# Patient Record
Sex: Male | Born: 1965 | Race: White | Hispanic: No | Marital: Single | Smoking: Current every day smoker
Health system: Southern US, Community
[De-identification: ages and names within clinical notes are randomized; demographics above are authoritative.]

## PROBLEM LIST (undated history)

## (undated) DIAGNOSIS — K219 Gastro-esophageal reflux disease without esophagitis: Secondary | ICD-10-CM

## (undated) DIAGNOSIS — K2970 Gastritis, unspecified, without bleeding: Secondary | ICD-10-CM

## (undated) DIAGNOSIS — B9681 Helicobacter pylori [H. pylori] as the cause of diseases classified elsewhere: Secondary | ICD-10-CM

## (undated) DIAGNOSIS — S42009A Fracture of unspecified part of unspecified clavicle, initial encounter for closed fracture: Secondary | ICD-10-CM

## (undated) DIAGNOSIS — T18128A Food in esophagus causing other injury, initial encounter: Secondary | ICD-10-CM

## (undated) HISTORY — PX: FACIAL FRACTURE SURGERY: SHX1570

---

## 1999-03-05 ENCOUNTER — Encounter: Payer: Self-pay | Admitting: Emergency Medicine

## 1999-03-05 ENCOUNTER — Emergency Department (HOSPITAL_COMMUNITY): Admission: EM | Admit: 1999-03-05 | Discharge: 1999-03-05 | Payer: Self-pay | Admitting: Emergency Medicine

## 1999-04-03 ENCOUNTER — Encounter: Payer: Self-pay | Admitting: Orthopedic Surgery

## 1999-04-03 ENCOUNTER — Ambulatory Visit (HOSPITAL_COMMUNITY): Admission: RE | Admit: 1999-04-03 | Discharge: 1999-04-04 | Payer: Self-pay | Admitting: Orthopedic Surgery

## 1999-06-21 ENCOUNTER — Ambulatory Visit (HOSPITAL_COMMUNITY): Admission: RE | Admit: 1999-06-21 | Discharge: 1999-06-21 | Payer: Self-pay | Admitting: Orthopedic Surgery

## 2002-07-09 ENCOUNTER — Emergency Department (HOSPITAL_COMMUNITY): Admission: EM | Admit: 2002-07-09 | Discharge: 2002-07-09 | Payer: Self-pay | Admitting: Emergency Medicine

## 2002-10-12 ENCOUNTER — Emergency Department (HOSPITAL_COMMUNITY): Admission: EM | Admit: 2002-10-12 | Discharge: 2002-10-12 | Payer: Self-pay | Admitting: *Deleted

## 2005-10-09 ENCOUNTER — Emergency Department (HOSPITAL_COMMUNITY): Admission: EM | Admit: 2005-10-09 | Discharge: 2005-10-10 | Payer: Self-pay | Admitting: Emergency Medicine

## 2006-10-24 ENCOUNTER — Emergency Department (HOSPITAL_COMMUNITY): Admission: EM | Admit: 2006-10-24 | Discharge: 2006-10-24 | Payer: Self-pay | Admitting: Emergency Medicine

## 2006-10-31 ENCOUNTER — Ambulatory Visit (HOSPITAL_COMMUNITY): Admission: RE | Admit: 2006-10-31 | Discharge: 2006-11-01 | Payer: Self-pay | Admitting: Oral and Maxillofacial Surgery

## 2007-03-07 ENCOUNTER — Emergency Department (HOSPITAL_COMMUNITY): Admission: EM | Admit: 2007-03-07 | Discharge: 2007-03-07 | Payer: Self-pay | Admitting: Emergency Medicine

## 2007-12-29 ENCOUNTER — Emergency Department (HOSPITAL_COMMUNITY): Admission: EM | Admit: 2007-12-29 | Discharge: 2007-12-29 | Payer: Self-pay | Admitting: Emergency Medicine

## 2007-12-29 ENCOUNTER — Ambulatory Visit: Payer: Self-pay | Admitting: Psychiatry

## 2007-12-29 ENCOUNTER — Inpatient Hospital Stay (HOSPITAL_COMMUNITY): Admission: RE | Admit: 2007-12-29 | Discharge: 2008-01-06 | Payer: Self-pay | Admitting: *Deleted

## 2008-01-07 ENCOUNTER — Emergency Department (HOSPITAL_COMMUNITY): Admission: EM | Admit: 2008-01-07 | Discharge: 2008-01-07 | Payer: Self-pay | Admitting: Emergency Medicine

## 2010-11-07 NOTE — H&P (Signed)
NAME:  Samuel Garcia, Samuel Garcia NO.:  [id number]   MEDICAL RECORD NO.:  [id number]          PATIENT TYPE:  IPS   LOCATION:  0300                          [facility]ACILITY:  [facility]   PHYSICIAN:  Geoffery Lyons, M.D.      DATE OF BIRTH:  1965-07-21   DATE OF ADMISSION:  12/29/2007  DATE OF DISCHARGE:                       PSYCHIATRIC ADMISSION ASSESSMENT   TIME OF ASSESSMENT:  9:40 a.m.   IDENTIFYING INFORMATION:  A 45 year old Caucasian male, divorced.  This  is a voluntary admission.   HISTORY OF PRESENT ILLNESS:  Samuel Garcia presented in the emergency room  yesterday after attempting to cut his wrist.  He has apparently tried to  cut his wrist with a razor and then thought better of it, and called the  sheriff, who brought him to the emergency room.Marland Kitchen  He reports that he has  been hopeless about his drug use and alcohol use.  Because of this, his  ex-wife will not allow him to see his 6 year old daughter.  Additionally, work has been giving him feedback that his mood is up and  down, that he is tearful and aggressive at work, sometimes, and they  would like to see him get treatment.  He endorses mood swings, depressed  mood and abuse of alcohol and cocaine.  He reports that he has been  drinking a fifth a day, most everyday of the week for several months and  is using cocaine mostly on the weekends.  Also taking Xanax, to help him  come down from the alcohol and cocaine, taking 4-5 at a time, and taking  an occasional Vicodin in spite of the itching that it causes him.  He  reports that he just falls apart mood-wise and cannot function if he  does not get his alcohol every day.  Endorses considerable shame and  guilt about this, depressed that he is not allowed to see his child  since February of this year, but is motivated for abstinence because his  ex-wife has promised him that then he can resume visitation with his  daughter.   PAST PSYCHIATRIC HISTORY:  First [facility] admission.  He has  a history of  prior admissions to [facility] in [place] for detox, [facility] twice, once for  detox and once for a 28-day rehab stay, and most recently about 6 years  ago was admitted to [facility] and afterwards had 2 years  of abstinence, during which he felt he functioned quite well.  He has a  history of 2 prior suicide attempts.   SOCIAL HISTORY:  Previously married, now divorced.  Has one 102 year old  daughter, who lives in the area.  His mother also lives in town and he  is close to her.  She is supportive.  Has a history of legal charges and  license suspended, but no current legal charges.  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   FAMILY HISTORY:  A grandfather and father with alcohol abuse.  Mother  with previous drug addiction, brother with drug addiction.   MEDICAL  HISTORY:  No regular primary care Samuel Garcia.  Medical problems  are superficial laceration, left wrist.   PAST MEDICAL HISTORY:  He has a history of previous facial fractures  with surgery, history of polysubstance abuse.  Positive history of  blackouts.   CURRENT MEDICATIONS:  None.   DRUG ALLERGIES:  HYDROCODONE WHICH CAUSES ITCHING.   PHYSICAL EXAMINATION:  Positive physical findings of physical exam done  in the emergency room, superficial laceration left wrist.  Did not  require sutures.  Motor and neuro nonfocal.  He does have rather  elaborate tattoos over his upper body.   DIAGNOSTIC STUDIES:  Were done in the emergency room.  CBC, WBC 7.3,  hemoglobin 15.8, hematocrit 46.9, platelets 241,000, MCV 94.6.  Chemistry, sodium 137, potassium 4.3, chloride 98, carbon dioxide 30,  BUN 8, creatinine 0.98 and random glucose 98.  Liver enzymes, SGOT 26,  SGPT 23, alkaline phosphatase 68 and total bilirubin 1.2.  TSH is  currently pending.  Alcohol level 229 mg/dL.  On admission urine drug  screen was positive for cocaine.   MENTAL STATUS  EXAMINATION:  Fully alert male, tearful, but appropriate,  polite, normal motor.  Speech coherent, relevant.  Gives an adequate  history.  Affect is blunted, fragile, readily tearful.  Mood depressed  with expressing some guilt and considerable shame over his inability to  parent and maintain relationship with his daughter.  This, he  recognizes, is due to his alcohol abuse and is his main motivation for  abstinence.  Thought processes logical, coherent.  Insight is adequate.  Has clearly been having some suicidal thoughts.  No active suicidal  thoughts today.  No signs of delirium or confusion.  Immediate, recent  and remote memory are intact.  Cognition is intact.  No evidence of  psychosis or homicidal thought.   AXIS I:  Alcohol abuse and dependence, polysubstance abuse.  Rule out  substance-induced mood disorder.  AXIS II:  Deferred.  AXIS III:  Superficial laceration, left wrist.  AXIS IV:  Severe issues with parenting and possible occupational issues.  AXIS V:  Current 48, past year not known.   PLAN:  Voluntarily admit and to evaluate his mood and give him a safe  detox within 4 days.  He has agreed to a family session with his mother,  who is his main support person.  We are waiting a return TSH and have  given him trazodone p.r.n. at bedtime and have started him on a Librium  detox protocol.  He is enrolled in our dual diagnosis program and will  evaluate his mood, as he makes detox progress.      Margaret A. Scott, N.P.      Geoffery Lyons, M.D.  Electronically Signed    MAS/MEDQ  D:  12/30/2007  T:  12/30/2007  Job:  [id number]

## 2010-11-10 NOTE — Op Note (Signed)
NAME:  Samuel Garcia, Samuel Garcia              ACCOUNT NO.:  [id number]   MEDICAL RECORD NO.:  [id number]          PATIENT TYPE:  OIB   LOCATION:  5735                         FACILITY:  [facility]   PHYSICIAN:  Marcelo Baldy, DDS, MDDATE OF BIRTH:  12-01-1965   DATE OF PROCEDURE:  10/31/2006  DATE OF DISCHARGE:  11/01/2006                               OPERATIVE REPORT   PREOPERATIVE DIAGNOSES:  1. Jerry Caras III fracture.  2. Bilateral orbital floor fractures.  3. Nasal fracture.  4. Nasal septal fracture.   POSTOPERATIVE DIAGNOSES:  1. Jerry Caras III fracture.  2. Bilateral orbital floor fractures.  3. Nasal fracture.  4. Nasal septal fracture.   SURGEON:  Marcelo Baldy, D.D.S., M.D.   PROCEDURE PERFORMED:  1. Open reduction and internal fixation of Le Fort III fracture.  2. Open reduction and internal fixation with orbital floor      reconstruction of left orbital floor fracture.  3. Open reduction and exploration of right orbital floor fracture.  4. Closed reduction of nasal septal fracture.  5. Closed reduction of nasal fracture.   ESTIMATED BLOOD LOSS:  150 mL.   COMPLICATIONS:  None.   INDICATIONS FOR PROCEDURE:  The patient is a 44 year old male who woke  up on the morning of May 1 and noted that he had severe facial pain and  swelling but had no recollection of what happened to him the night  before.  He presented to the [facility] Emergency Room where a facial CT  scan was done as well as a head CT and C-spine studies.  He was found to  have multiple nasal fractures and was given followup in our office the  following Monday.  On his preoperative exam, his entire mid face complex  was found to be mobile up to the zygomaticofrontal sutures including his  nasal complex.  He was also noted to have severe diplopia in all  directions of gaze and entrapment with his extraocular movements  especially in the left eye.  He was sent to the ophthalmologist for  evaluation and  clearance for surgery and was cleared for his surgery by  Dr. Dagoberto Ligas.  After all the risks, benefits and alternatives of the  procedure were explained to the patient, he freely consented to the  procedure.   PROCEDURE IN DETAIL:  The patient was initially identified in the  holding area and with a fine surgical marker, the lateral aspect of his  upper eyelid creases were marked.  He ws then taken to the operating  room, placed on the operating room table in the supine position.  Leads  and monitors were placed by the anesthesia team in a standard fashion.  He was then induced and intubated nasally.  Bilateral breath sounds and  end tidal CO2 confirmed accurate tube placement.  The nasotracheal tube  was secured to the patient's head via the surgical team in a standard  fashion.  The patient was then prepped and draped in the appropriate  sterile fashion for a facial trauma procedure.  Corneal shields were  placed with Lacri-Lube in both eyes.  Approximately 15 mL of 1%  lidocaine with 1:100,000 epinephrine was infiltrated along the planned  incision lines which included bilateral upper eyelid creases, bilateral  transconjunctival incisions and a bilateral maxillary vestibular  incision.  Approximately 10 mL of 1% lidocaine with 1:100,000  epinephrine was locally infiltrated about the nasal bone and along the  nasal septum.  First, Erich arch bars were adapted to the maxillary  mandibular dentition and secured into place using 26 gauge interdental  wires.  The patient was then placed into maxillomandibular fixation.  Prior to this being done, a moist Ray-Tec sponge was placed in the  patient's oropharynx with a throat pack and the patient's mouth was  prepared with chlorhexidine and a tooth brush.  After the patient was  placed into maxillomandibular fixation, attention was turned to the  patient's left side.  An incision was made in the lateral aspect of the  upper eyelid creased  through the skin.  The incision was then carefully  retracted over the zygomaticofrontal suture.  The incision was then  carried down through the periosteum and subperiosteal dissection was  carried out to expose the left zygomaticofrontal fracture.  The fracture  in this area was noted to be severely displaced.  A moist Ray-Tec sponge  was placed and attention was turned to the intraoral exposure of the  maxillary and zygomatic fractures.   An incision was made in the right maxillary vestibule with the 15 blade.  The incision was carried down through the periosteum.  Subperiosteal  dissection was carried out exposing the anterior maxillary wall, the  piriform rim and the zygomatic buttress.  The patient was noted to have  a severely comminuted fracture in this area through the piriform rim  through the anterior maxillary wall, posterior maxillary wall and the  segment buttress.  Reduction of the zygomaticomaxillary complex fracture  was attempted with [facility] elevator, however, I was not able to reduce  the fracture satisfactorily so at this point, a Carroll-Girard screw was  deemed necessary to assist in reduction of the fracture.  Approximately  1 mL of 1% lidocaine with 1:100,000 epinephrine was infiltrated in the  skin overlying the prominence of the zygoma.  A 1-cm incision was made  through the skin and blunt dissection was carried down to the  periosteum.  The periosteum was then incised and subperiosteal  dissection was carried out to expose a small area just large enough to  place a pilot hole and the Carroll-Girard screw into the prominence of  the zygoma.  A Carroll-Girard screw was placed and the zygoma was  manipulated so that the fracture was reduced in a satisfactory fashion.   At this point, a moist pack was placed intraorally and we turned our  attention to the transconjunctival incision on the left side.  A  transconjunctival incision was created using needle tip  monopolar cautery.  The globe was protected at all times with a corneal shield and  a Yagar retractor.  The transconjunctival incision was carried  retroseptally down to the periosteum.  The periosteum was incised with  the monopolar cautery.  The subperiosteal dissection was then carried  out along the orbital floor to expose the orbital floor fracture.  The  orbital floor fracture was noted to be approximately 1.5 x 1.5 cm.  Orbital contents had herniated into the maxillary sinus.  These were  gently elevated and released and the infraorbital rim fracture was able  to be visualized directly while reducing the  zygomaticomaxillary complex  fracture.   At this point, fixation was begun starting with the zygomaticofrontal  suture and using the Carroll-Girard screw to hole the zygoma in its  appropriately reduced position.  A 6-hole 1.7 mm Stryker plate was used  to span the zygomaticofrontal suture and was secured in place with  several screws.  Attention was then turned to the fracture's access  through our intraoral incision.  Several 1.7 mm Stryker plates were used  to reapproximated the severely comminuted left maxillary wall and  piriform rim and zygomatic buttress fractures.  The posterior maxillary  wall was noted to be fractured as well and a large segment was loose,  however, was still attached to the mucosa on the medial side and  somewhat to the periosteum laterally.  This portion of bone was secured  in place using a 26-gauge wire, wiring it to its junction at the  zygomatic buttress.   Attention was then turned back to the orbital floor.  The orbital  contents were once again freed gently from the maxillary sinus.  The  orbital floor was reconstructed with a 1.2 mm Stryker orbital floor  plate, which was secured into place using several screws along the  orbital rim.  This also helped secure the infraorbital rim fracture. The  mobility of the eye was then tested using a  force duction test and all  of these tissues were noted to be freely mobile.  All of the fracture  sites on the left side were then irrigated copiously and a moist pack  was placed intraorally and attention was turned to the patient's right  side.   An incision was made in the right upper eyelid crease in the lateral  aspect.  The incision was approximately 0.5 cm in length.  The incision  was retracted carefully over the zygomaticofrontal suture and the  incision was carried down through the periosteum overlying the  zygomaticofrontal suture.  Subperiosteal dissection was carried out  exposing the fracture which was noted to be minimally displaced.  A  moist pack was placed and attention was turned to the intraoral  incision.   An incision was created in the right maxillary vestibule which was  joined with the left incision.  The incision was carried down through  the periosteum.  Subperiosteal dissection was carried out exposing the intramaxillary wall buttress and the piriform rim area.  Fractures were  noted through the piriform rim and along the maxillary buttress as well.  These fractures again were only minimally displaced.  A moist pack was  placed.   Attention was turned to the right transconjunctival incision.  This  incision was created after protecting the globe with corneal shield and  Yagar retractor through the conjunctiva using a needle point monopolar  cautery.  The incision was carried retroseptally down through the  periosteum.  The periosteal dissection was carried out exposing the  right orbital floor fracture.  The fracture was noted to be linear in  nature, however, there were some orbital contents that were trapped  within the fracture line.  These contents were gently freed and after  the contents were freed, the fracture line was again noted to simply be  linear and there was no defect.  A piece of Gelfilm was placed along the  orbital floor and the wound  edges were ensured to be inverted and  hemostasis was verified prior to leaving this particular incision site.  A Goldman elevator was used to gently  reduce the right  zygomaticomaxillary complex fracture and the zygoma was secured into  place using a 4-hole 1.2 mm plate at the right zygomaticofrontal suture  and multiple 1.7 mm plates along the right zygomatic buttress and the  right piriform rim area.  After this was completed, all of the sites  were irrigated copiously on both sides and attention was turned to  closing all the incision.   The intraoral incision was closed using 2-0 PDS suture to reapproximate  the alar bases and a 3-0 chromic gut suture to close the midline aspect  of the incision in a V-Y fashion and the remainder of the incision in a  running baseball stitch.  The right and left upper eyelid incisions were  closed in layers using 4-0 Vicryl in the orbicularis oculi layer and  periosteal layers.  The skin was then closed using 6-0 Prolene suture in  a running fashion.  The transconjunctival incisions were simply  reapproximated manually being sure to evert the edges of the tissue.  Both corneal shields were removed.  The cheek incision where a Campbell Riches screw was placed was closed with a 5-0 plain gut suture.   At this point, ice soaked eye patch were placed on bilateral eyes and  attention was turned to the nasal fracture.   Due to the nasal tubes being in place, manipulation of the nose was  mostly through the left side where the fracture was more pronounced.  A  Goldman elevator was used to elevate the nasal bone and an Asch forceps  was used to place the septum in the midline.  A Denver splint was then  adapted and secured in place using Mastisol and Steri-Strips.   At this point, the patient was taken out of maxillomandibular fixation.  The oropharynx was suctioned thoroughly.  The throat pack was removed  and the oropharynx was once again suctioned  thoroughly.  The patient was placed back into maxillomandibular fixation using 26-gauge wire and at  this point, was turned over to the anesthesia team for awakening and  extubation.  The patient was extubated without difficulty and after  extubation, reinspection of the nasal bone and reelevation of the nasal  bones was performed to ensure that the extubation did not move the nasal  bones or nasal septum.   At this point, the patient was transported to the recovery room in  stable condition, having tolerated the procedure well.  At the end of  the procedure, all needle and sponge counts were correct times 2 and  there were no complications for the procedure.      Marcelo Baldy, DDS, MD  Electronically Signed     TGB/MEDQ  D:  11/14/2006  T:  11/14/2006  Job:  [id number]
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
## 2010-11-10 NOTE — H&P (Signed)
[place]. [facility]  Patient:    Samuel Garcia                      MRN: [id number] Adm. Date:  [id number] Disc. Date: [id number] Attending:  Wende Mott                         History and Physical  CHIEF COMPLAINT:  Painful left shoulder.  HISTORY OF PRESENT ILLNESS:  This is a 45 year old male who has sustained a left comminuted clavicle fracture a few months ago, and had to undergo an open reduction and internal fixation.  The patient presently is having some discomfort because the plate and screw has come loose, and are pressing up against the skin.  The patients x-rays revealed good callus of bone formation across the fracture.  PAST MEDICAL HISTORY:  That of an ORIF of the left clavicle.  No history of high blood pressure or diabetes mellitus.  ALLERGIES:  HYDROCODONE causes hives.  CURRENT MEDICATIONS:  Darvocet and Vioxx.  SOCIAL HISTORY:  Habits:  Smokes one pack of cigarettes per day.  FAMILY HISTORY:  Noncontributory.  REVIEW OF SYSTEMS:  Basically that of the history of present illness. No cardiac or respiratory.  No urinary or bowel symptoms.  PHYSICAL EXAMINATION:  VITAL SIGNS:  Temperature 98.9 degrees, pulse 92, respirations 18, blood pressure 143/82.  Height 5 feet 11 inches, weight 211 pounds.  HEENT:  Normocephalic.  Eyes:  Conjunctivae and sclerae clear.  NECK:  Supple.  CHEST:  Clear.  CARDIAC:  S1, S2 regular.  EXTREMITIES:  Left shoulder:  Prominence of the palpable plate over the left clavicle.  The fracture site itself feels stable.  Range of motion of the left shoulder is good with good muscle tone.  Neurovascular status is intact.  IMPRESSION:  Loose hardware of the left clavicle. DD:  08/08/99 TD:  08/08/99 Job: [id number]

## 2011-03-22 LAB — RAPID URINE DRUG SCREEN, HOSP PERFORMED
Amphetamines: NOT DETECTED
Barbiturates: NOT DETECTED
Cocaine: POSITIVE — AB
Opiates: NOT DETECTED

## 2011-03-22 LAB — BASIC METABOLIC PANEL
BUN: 8
CO2: 30
Glucose, Bld: 98
Potassium: 4.3
Sodium: 137

## 2011-03-22 LAB — URINALYSIS, ROUTINE W REFLEX MICROSCOPIC
Hgb urine dipstick: NEGATIVE
Nitrite: NEGATIVE
Specific Gravity, Urine: <1.005 — ABNORMAL LOW
Urobilinogen, UA: 0.2
pH: 6

## 2011-03-22 LAB — DIFFERENTIAL
Basophils Absolute: 0
Basophils Relative: 0
Eosinophils Absolute: 0.8 — ABNORMAL HIGH
Eosinophils Relative: 10 — ABNORMAL HIGH
Monocytes Absolute: 0.7

## 2011-03-22 LAB — CBC
HCT: 46.9
Hemoglobin: 15.8
MCHC: 33.8
RDW: 13.7

## 2011-03-22 LAB — HEPATIC FUNCTION PANEL
Bilirubin, Direct: 0.1
Indirect Bilirubin: 1.1 — ABNORMAL HIGH

## 2011-03-22 LAB — TSH: TSH: 1.301 (ref 0.350–4.500)

## 2011-04-06 LAB — BASIC METABOLIC PANEL
Chloride: 108
GFR calc non Af Amer: >60
Glucose, Bld: 97
Potassium: 3.8
Sodium: 144

## 2011-04-06 LAB — RAPID URINE DRUG SCREEN, HOSP PERFORMED
Barbiturates: NOT DETECTED
Opiates: NOT DETECTED
Tetrahydrocannabinol: NOT DETECTED

## 2013-07-26 DIAGNOSIS — T18128A Food in esophagus causing other injury, initial encounter: Secondary | ICD-10-CM

## 2013-07-26 HISTORY — DX: Food in esophagus causing other injury, initial encounter: T18.128A

## 2013-08-06 ENCOUNTER — Encounter (HOSPITAL_COMMUNITY)
Admission: EM | Disposition: A | Discharge status: Home / Self Care | Payer: Self-pay | Source: Home / Self Care | Attending: Emergency Medicine

## 2013-08-06 DIAGNOSIS — R1310 Dysphagia, unspecified: Secondary | ICD-10-CM

## 2013-08-06 DIAGNOSIS — K259 Gastric ulcer, unspecified as acute or chronic, without hemorrhage or perforation: Secondary | ICD-10-CM

## 2013-08-06 DIAGNOSIS — K2990 Gastroduodenitis, unspecified, without bleeding: Secondary | ICD-10-CM

## 2013-08-06 DIAGNOSIS — K2970 Gastritis, unspecified, without bleeding: Secondary | ICD-10-CM

## 2013-08-06 DIAGNOSIS — T18108A Unspecified foreign body in esophagus causing other injury, initial encounter: Secondary | ICD-10-CM

## 2013-08-06 HISTORY — PX: FOREIGN BODY REMOVAL: SHX962

## 2013-08-06 SURGERY — REMOVAL, FOREIGN BODY
Anesthesia: Moderate Sedation

## 2013-08-07 ENCOUNTER — Emergency Department (HOSPITAL_COMMUNITY)
Admission: EM | Admit: 2013-08-07 | Discharge: 2013-08-07 | Disposition: A | Discharge status: Home / Self Care | Payer: PRIVATE HEALTH INSURANCE | Attending: Emergency Medicine | Admitting: Emergency Medicine

## 2013-08-07 ENCOUNTER — Encounter (HOSPITAL_COMMUNITY): Payer: Self-pay | Admitting: Pharmacy Technician

## 2013-08-07 ENCOUNTER — Encounter (HOSPITAL_COMMUNITY): Payer: Self-pay | Admitting: Emergency Medicine

## 2013-08-07 DIAGNOSIS — K219 Gastro-esophageal reflux disease without esophagitis: Secondary | ICD-10-CM | POA: Diagnosis present

## 2013-08-07 DIAGNOSIS — K222 Esophageal obstruction: Secondary | ICD-10-CM

## 2013-08-07 DIAGNOSIS — T39095A Adverse effect of salicylates, initial encounter: Secondary | ICD-10-CM | POA: Insufficient documentation

## 2013-08-07 DIAGNOSIS — T18108A Unspecified foreign body in esophagus causing other injury, initial encounter: Secondary | ICD-10-CM

## 2013-08-07 DIAGNOSIS — T18128A Food in esophagus causing other injury, initial encounter: Secondary | ICD-10-CM

## 2013-08-07 DIAGNOSIS — F17200 Nicotine dependence, unspecified, uncomplicated: Secondary | ICD-10-CM | POA: Insufficient documentation

## 2013-08-07 DIAGNOSIS — K2970 Gastritis, unspecified, without bleeding: Secondary | ICD-10-CM | POA: Insufficient documentation

## 2013-08-07 DIAGNOSIS — Q391 Atresia of esophagus with tracheo-esophageal fistula: Secondary | ICD-10-CM | POA: Insufficient documentation

## 2013-08-07 DIAGNOSIS — K200 Eosinophilic esophagitis: Secondary | ICD-10-CM | POA: Insufficient documentation

## 2013-08-07 DIAGNOSIS — Q393 Congenital stenosis and stricture of esophagus: Secondary | ICD-10-CM

## 2013-08-07 DIAGNOSIS — B9681 Helicobacter pylori [H. pylori] as the cause of diseases classified elsewhere: Secondary | ICD-10-CM | POA: Diagnosis present

## 2013-08-07 DIAGNOSIS — IMO0002 Reserved for concepts with insufficient information to code with codable children: Secondary | ICD-10-CM | POA: Insufficient documentation

## 2013-08-07 DIAGNOSIS — K269 Duodenal ulcer, unspecified as acute or chronic, without hemorrhage or perforation: Secondary | ICD-10-CM | POA: Insufficient documentation

## 2013-08-07 DIAGNOSIS — K2990 Gastroduodenitis, unspecified, without bleeding: Secondary | ICD-10-CM

## 2013-08-07 DIAGNOSIS — A048 Other specified bacterial intestinal infections: Secondary | ICD-10-CM | POA: Insufficient documentation

## 2013-08-07 HISTORY — DX: Helicobacter pylori (H. pylori) as the cause of diseases classified elsewhere: B96.81

## 2013-08-07 HISTORY — DX: Gastro-esophageal reflux disease without esophagitis: K21.9

## 2013-08-07 HISTORY — DX: Food in esophagus causing other injury, initial encounter: T18.128A

## 2013-08-07 HISTORY — DX: Gastritis, unspecified, without bleeding: K29.70

## 2013-08-07 LAB — CBC WITH DIFFERENTIAL/PLATELET
BASOS ABS: 0 10*3/uL (ref 0.0–0.1)
Basophils Relative: 0 % (ref 0–1)
EOS ABS: 0.4 10*3/uL (ref 0.0–0.7)
EOS PCT: 4 % (ref 0–5)
HCT: 48.2 % (ref 39.0–52.0)
Hemoglobin: 17.1 g/dL — ABNORMAL HIGH (ref 13.0–17.0)
LYMPHS ABS: 3.6 10*3/uL (ref 0.7–4.0)
Lymphocytes Relative: 41 % (ref 12–46)
MCH: 32.1 pg (ref 26.0–34.0)
MCHC: 35.5 g/dL (ref 30.0–36.0)
MCV: 90.6 fL (ref 78.0–100.0)
Monocytes Absolute: 0.5 10*3/uL (ref 0.1–1.0)
Monocytes Relative: 6 % (ref 3–12)
NEUTROS PCT: 49 % (ref 43–77)
Neutro Abs: 4.2 10*3/uL (ref 1.7–7.7)
PLATELETS: 224 10*3/uL (ref 150–400)
RBC: 5.32 MIL/uL (ref 4.22–5.81)
RDW: 12.4 % (ref 11.5–15.5)
WBC: 8.7 10*3/uL (ref 4.0–10.5)

## 2013-08-07 LAB — BASIC METABOLIC PANEL
BUN: 16 mg/dL (ref 6–23)
CALCIUM: 8.9 mg/dL (ref 8.4–10.5)
CO2: 24 mEq/L (ref 19–32)
Chloride: 102 mEq/L (ref 96–112)
Creatinine, Ser: 0.88 mg/dL (ref 0.50–1.35)
GLUCOSE: 92 mg/dL (ref 70–99)
POTASSIUM: 4.1 meq/L (ref 3.7–5.3)
SODIUM: 143 meq/L (ref 137–147)

## 2013-08-07 MED ORDER — MEPERIDINE HCL 100 MG/ML IJ SOLN
INTRAMUSCULAR | Status: AC
  Filled 2013-08-07: qty 2

## 2013-08-07 MED ORDER — MEPERIDINE HCL 25 MG/ML IJ SOLN
INTRAMUSCULAR | Status: DC | PRN
  Administered 2013-08-07 (×2): 50 mg via INTRAVENOUS

## 2013-08-07 MED ORDER — MIDAZOLAM HCL 5 MG/5ML IJ SOLN
INTRAMUSCULAR | Status: AC
  Filled 2013-08-07: qty 10

## 2013-08-07 MED ORDER — GLUCAGON HCL (RDNA) 1 MG IJ SOLR
1.0000 mg | Freq: Once | INTRAMUSCULAR | Status: AC
  Administered 2013-08-07: 1 mg via INTRAVENOUS
  Filled 2013-08-07: qty 1

## 2013-08-07 MED ORDER — SODIUM CHLORIDE 0.9 % IJ SOLN
INTRAMUSCULAR | Status: AC
  Filled 2013-08-07: qty 10

## 2013-08-07 MED ORDER — PROMETHAZINE HCL 25 MG/ML IJ SOLN
INTRAMUSCULAR | Status: DC | PRN
  Administered 2013-08-07: 12.5 mg via INTRAVENOUS

## 2013-08-07 MED ORDER — OMEPRAZOLE 20 MG PO CPDR: DELAYED_RELEASE_CAPSULE | ORAL | Status: DC

## 2013-08-07 MED ORDER — PROMETHAZINE HCL 25 MG/ML IJ SOLN
INTRAMUSCULAR | Status: AC
  Filled 2013-08-07: qty 1

## 2013-08-07 MED ORDER — MIDAZOLAM HCL 5 MG/5ML IJ SOLN
INTRAMUSCULAR | Status: DC | PRN
  Administered 2013-08-07 (×2): 2 mg via INTRAVENOUS
  Administered 2013-08-07: 1 mg via INTRAVENOUS

## 2013-08-07 NOTE — ED Notes (Signed)
Pt signed permit for EGD

## 2013-08-07 NOTE — H&P (Signed)
  Primary Care Physician:  No PCP Per Patient Primary Gastroenterologist:  Dr. Darrick PennaFields  Pre-Procedure History & Physical: HPI:  Samuel Garcia is a 48 y.o. male here for ?FOOD IMPACTION.   History reviewed. No pertinent past medical history.  History reviewed. No pertinent past surgical history.  Prior to Admission medications   Not on File    Allergies as of 08/07/2013 - Review Complete 08/07/2013  Allergen Reaction Noted  . Hydrocodone  08/07/2013    No family history on file.  History   Social History  . Marital Status: Divorced    Spouse Name: N/A    Number of Children: N/A  . Years of Education: N/A   Occupational History  . Not on file.   Social History Main Topics  . Smoking status: Current Every Day Smoker  . Smokeless tobacco: Not on file  . Alcohol Use: Yes  . Drug Use: No  . Sexual Activity: Not on file   Other Topics Concern  . Not on file   Social History Narrative  . No narrative on file    Review of Systems: See HPI, otherwise negative ROS   Physical Exam: BP 132/86  Pulse 93  Temp(Src) 97.9 F (36.6 C) (Oral)  Resp 20  Ht 5\' 11"  (1.803 m)  Wt 200 lb (90.719 kg)  BMI 27.91 kg/m2  SpO2 97% General:   Alert,  pleasant and cooperative in NAD Head:  Normocephalic and atraumatic. Neck:  Supple; Lungs:  Clear throughout to auscultation.    Heart:  Regular rate and rhythm. Abdomen:  Soft, nontender and nondistended. Normal bowel sounds, without guarding, and without rebound.   Neurologic:  Alert and  oriented x4;  grossly normal neurologically.  Impression/Plan:     FOOD IMPACTION  PLAN:  EGD/?DIL TODAY

## 2013-08-07 NOTE — Op Note (Addendum)
[facility] [address] [place], [id number]   ENDOSCOPY PROCEDURE REPORT  PATIENT: Samuel Garcia, Samuel B.  MR#: [id number] BIRTHDATE: 21-Oct-1965 , 47  yrs. old GENDER: Male  ENDOSCOPIST: Jonette EvaSandi Laymon Stockert, MD REFERRED BY:   DR. Preston FleetingGLICK  PROCEDURE DATE: 08/07/2013 PROCEDURE:   EGD with foreign body removal, EGD with biopsy, and EGD with dilatation over guidewire  INDICATIONS: IMPACTED FOOD BOLUS. SAME THING [place]   dysphagia. TAKES ASA FOR SHOULDER PAIN. MEDICATIONS: Demerol 100 mg IV, Versed 5 mg IV, and Promethazine (Phenergan) 12.5mg  IV TOPICAL ANESTHETIC:   Cetacaine Spray  DESCRIPTION OF PROCEDURE:     Physical exam was performed.  Informed consent was obtained from the patient after explaining the benefits, risks, and alternatives to the procedure.  The patient was connected to the monitor and placed in the left lateral position.  Continuous oxygen was provided by nasal cannula and IV medicine administered through an indwelling cannula.  After administration of sedation, the patients esophagus was intubated and the EG-[id number] ([id number]  endoscope was advanced under direct visualization to the second portion of the duodenum.  The scope was removed slowly by carefully examining the color, texture, anatomy, and integrity of the mucosa on the way out.  The patient was recovered in endoscopy and discharged home in satisfactory condition.   ESOPHAGUS: FOOD BOLUS IMPACTED IN MID-ESOPHAGUS.  REMOVED PIECEMEAL VIA ESOPHAGEAL OVERTUBE.  BIOPSIE SOBTAINED VIA COLD FORCEPS 20 CM AND 35 CM FROM THE TEETH.  GE JUNCTION 40 CM FROM THE TEETH.   A medium sized hiatal hernia was noted.   STOMACH: Multiple small ulcers were found in the gastric body.  Biopsies were taken around the ulcers.   Moderate erosive gastritis (inflammation) was found in the gastric antrum.  Multiple biopsies were performed using cold forceps.   DUODENUM: Multiple small ulcers  were found in the duodenal bulb.   The duodenal mucosa showed no abnormalities in the 2nd part of the duodenum. COMPLICATIONS:   None  ENDOSCOPIC IMPRESSION: DYSPHAGIA DUE TO ESOPHAGEAL WEB V.  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 START OMEPRAZOLE.  TAKE 30 MINUTES PRIOR TO YOUR MEALS TWICE DAILY FOR 3 MOS. STRICTLY AVOID ASPIRIN, BC/GOODY POWDERS, IBUPROFEN/MOTRIN, OR NAPROXEN/ALEVE FOR 7 DAYS. FOLLOW A LOW FAT DIET. BIOPSY WILL BE BACK IN 7 DAYS. FOLLOW UP IN 3 MOS.  REPEAT EGD AFTER NEXT VISIT.  REPEAT EXAM:   _______________________________ Rosalie DoctoreSignedJonette Eva:  Serenity Batley, MD 08/15/2013 10:40 AM Revised: 08/15/2013 10:40 AM      PATIENT NAME:  Samuel Garcia, Samuel B. MR#: [id number]

## 2013-08-07 NOTE — ED Notes (Signed)
Pt states he ate some beef stew this am and thinks a piece of meat is stuck in his throat, cannot swallow anything

## 2013-08-07 NOTE — ED Notes (Signed)
To ENDO suite via stretcher

## 2013-08-07 NOTE — ED Notes (Signed)
For EGD tonight, pt and his wife are aware.

## 2013-08-07 NOTE — ED Provider Notes (Signed)
CSN: [id number]     Arrival date & time 08/07/13  [id number] History   First MD Initiated Contact with Patient 08/07/13 [phone]     Chief complaint: Unable to swallow  (Consider location/radiation/quality/duration/timing/severity/associated sxs/prior Treatment) The history is provided by the patient.   48 year old male was eating some beef stew at about 2 AM and pieces of meat got stuck going down and he has been unable to swallow since then. This has happened twice before and has required surgery to fix the problem on one occasion and on another occasion he received medication which fix that. He does have a history of GERD and was recommended for esophageal dilatation but has never proceeded with that. At home, he tried to force himself to vomit but was unable to dislodge the piece of meat.  History reviewed. No pertinent past medical history. History reviewed. No pertinent past surgical history. No family history on file. History  Substance Use Topics  . Smoking status: Current Every Day Smoker  . Smokeless tobacco: Not on file  . Alcohol Use: Yes    Review of Systems  All other systems reviewed and are negative.      Allergies  Hydrocodone  Home Medications  No current outpatient prescriptions on file. BP 140/93  Pulse 97  Temp(Src) 97.9 F (36.6 C) (Oral)  Resp 18  Ht 5\' 11"  (1.803 m)  Wt 200 lb (90.719 kg)  BMI 27.91 kg/m2  SpO2 95% Physical Exam  Nursing note and vitals reviewed.  48 year old male, resting comfortably and in no acute distress. Vital signs are significant for borderline hypertension with blood pressure 140/93. Oxygen saturation is 95%, which is normal. Head is normocephalic and atraumatic. PERRLA, EOMI. Oropharynx is mildly erythematous. There is no pooling of secretions and he is protecting his airway. Neck is nontender and supple without adenopathy or JVD. Back is nontender and there is no CVA tenderness. Lungs are clear without rales, wheezes, or  rhonchi. Chest is nontender. Heart has regular rate and rhythm without murmur. Abdomen is soft, flat, nontender without masses or hepatosplenomegaly and peristalsis is normoactive. Extremities have no cyanosis or edema, full range of motion is present. Skin is warm and dry without rash. Neurologic: Mental status is normal, cranial nerves are intact, there are no motor or sensory deficits.  ED Course  Procedures (including critical care time) Labs Review Results for orders placed during the hospital encounter of 08/07/13  CBC WITH DIFFERENTIAL      Result Value Ref Range   WBC 8.7  4.0 - 10.5 K/uL   RBC 5.32  4.22 - 5.81 MIL/uL   Hemoglobin 17.1 (*) 13.0 - 17.0 g/dL   HCT 30.848.2  65.739.0 - 84.652.0 %   MCV 90.6  78.0 - 100.0 fL   MCH 32.1  26.0 - 34.0 pg   MCHC 35.5  30.0 - 36.0 g/dL   RDW 96.212.4  95.211.5 - 84.115.5 %   Platelets 224  150 - 400 K/uL   Neutrophils Relative % 49  43 - 77 %   Neutro Abs 4.2  1.7 - 7.7 K/uL   Lymphocytes Relative 41  12 - 46 %   Lymphs Abs 3.6  0.7 - 4.0 K/uL   Monocytes Relative 6  3 - 12 %   Monocytes Absolute 0.5  0.1 - 1.0 K/uL   Eosinophils Relative 4  0 - 5 %   Eosinophils Absolute 0.4  0.0 - 0.7 K/uL   Basophils Relative 0  0 -  1 %   Basophils Absolute 0.0  0.0 - 0.1 K/uL  BASIC METABOLIC PANEL      Result Value Ref Range   Sodium 143  137 - 147 mEq/L   Potassium 4.1  3.7 - 5.3 mEq/L   Chloride 102  96 - 112 mEq/L   CO2 24  19 - 32 mEq/L   Glucose, Bld 92  70 - 99 mg/dL   BUN 16  6 - 23 mg/dL   Creatinine, Ser 1.19  0.50 - 1.35 mg/dL   Calcium 8.9  8.4 - 14.7 mg/dL   GFR calc non Af Amer >90  >90 mL/min   GFR calc Af Amer >90  >90 mL/min   MDM   Final diagnoses:  Esophageal obstruction due to food impaction    Esophageal obstruction from meat. Old records are reviewed and I do not see his prior episodes of esophageal obstruction. He will be given a therapeutic trial of glucagon. If that fails, he will need GI consultation.  He did not improve  after glucagon. Dr. Darrick Penna has been consulted who states she will see him in the ED for endoscopy.  Dione Booze, MD 08/07/13 [phone]

## 2013-08-07 NOTE — Discharge Instructions (Signed)
I dilated your esophagus. You have a stricture near the base of your esophagus. You have a small hiatal hernia. You have ULCERS IN YOU STOMACH AND DUODENUM. I biopsied your ESOPHAGUS AND stomach.   RETURN TO WORK FEB 14.  START OMEPRAZOLE.  TAKE 30 MINUTES PRIOR TO YOUR MEALS TWICE DAILY FOR 3 MOS.  STRICTLY AVOID ASPIRIN, BC/GODDY POWDERS, IBUPROFEN/MOTRIN, OR NAPROXEN/ALEVE FOR 7 DAYS.  FOLLOW A LOW FAT DIET. SEE INFO BELOW.  YOUR BIOPSY WILL BE BACK IN 7 DAYS.  FOLLOW UP IN 3 MOS.    UPPER ENDOSCOPY AFTER CARE Read the instructions outlined below and refer to this sheet in the next week. These discharge instructions provide you with general information on caring for yourself after you leave the hospital. While your treatment has been planned according to the most current medical practices available, unavoidable complications occasionally occur. If you have any problems or questions after discharge, call DR. Kennidi Yoshida, [phone].  ACTIVITY  You may resume your regular activity, but move at a slower pace for the next 24 hours.   Take frequent rest periods for the next 24 hours.   Walking will help get rid of the air and reduce the bloated feeling in your belly (abdomen).   No driving for 24 hours (because of the medicine (anesthesia) used during the test).   You may shower.   Do not sign any important legal documents or operate any machinery for 24 hours (because of the anesthesia used during the test).    NUTRITION  Drink plenty of fluids.   You may resume your normal diet as instructed by your doctor.   Begin with a light meal and progress to your normal diet. Heavy or fried foods are harder to digest and may make you feel sick to your stomach (nauseated).   Avoid alcoholic beverages for 24 hours or as instructed.    MEDICATIONS  You may resume your normal medications.   WHAT YOU CAN EXPECT TODAY  Some feelings of bloating in the abdomen.   Passage of more  gas than usual.    IF YOU HAD A BIOPSY TAKEN DURING THE UPPER ENDOSCOPY:  Eat a soft diet IF YOU HAVE NAUSEA, BLOATING, ABDOMINAL PAIN, OR VOMITING.    FINDING OUT THE RESULTS OF YOUR TEST Not all test results are available during your visit. DR. Darrick Penna WILL CALL YOU WITHIN 7 DAYS OF YOUR PROCEDUE WITH YOUR RESULTS. Do not assume everything is normal if you have not heard from DR. Kawhi Diebold IN ONE WEEK, CALL HER OFFICE AT [phone].  SEEK IMMEDIATE MEDICAL ATTENTION AND CALL THE OFFICE: [phone] IF:  You have more than a spotting of blood in your stool.   Your belly is swollen (abdominal distention).   You are nauseated or vomiting.   You have a temperature over 101F.   You have abdominal pain or discomfort that is severe or gets worse throughout the day.    Low-Fat Diet BREADS, CEREALS, PASTA, RICE, DRIED PEAS, AND BEANS These products are high in carbohydrates and most are low in fat. Therefore, they can be increased in the diet as substitutes for fatty foods. They too, however, contain calories and should not be eaten in excess. Cereals can be eaten for snacks as well as for breakfast.  Include foods that contain fiber (fruits, vegetables, whole grains, and legumes). Research shows that fiber may lower blood cholesterol levels, especially the water-soluble fiber found in fruits, vegetables, oat products, and legumes. FRUITS AND VEGETABLES It is  good to eat fruits and vegetables. Besides being sources of fiber, both are rich in vitamins and some minerals. They help you get the daily allowances of these nutrients. Fruits and vegetables can be used for snacks and desserts. MEATS Limit lean meat, chicken, [place], and fish to no more than 6 ounces per day. Beef, Pork, and Lamb Use lean cuts of beef, pork, and lamb. Lean cuts include:  Extra-lean ground beef.  Arm roast.  Sirloin tip.  Center-cut ham.  Round steak.  Loin chops.  Rump roast.  Tenderloin.  Trim all fat off  the outside of meats before cooking. It is not necessary to severely decrease the intake of red meat, but lean choices should be made. Lean meat is rich in protein and contains a highly absorbable form of iron. Premenopausal women, in particular, should avoid reducing lean red meat because this could increase the risk for low red blood cells (iron-deficiency anemia).  Chicken and [place] These are good sources of protein. The fat of poultry can be reduced by removing the skin and underlying fat layers before cooking. Chicken and [place] can be substituted for lean red meat in the diet. Poultry should not be fried or covered with high-fat sauces. Fish and Shellfish Fish is a good source of protein. Shellfish contain cholesterol, but they usually are low in saturated fatty acids. The preparation of fish is important. Like chicken and [place], they should not be fried or covered with high-fat sauces. EGGS Egg whites contain no fat or cholesterol. They can be eaten often. Try 1 to 2 egg whites instead of whole eggs in recipes or use egg substitutes that do not contain yolk.  MILK AND DAIRY PRODUCTS Use skim or 1% milk instead of 2% or whole milk. Decrease whole milk, natural, and processed cheeses. Use nonfat or low-fat (2%) cottage cheese or low-fat cheeses made from vegetable oils. Choose nonfat or low-fat (1 to 2%) yogurt. Experiment with evaporated skim milk in recipes that call for heavy cream. Substitute low-fat yogurt or low-fat cottage cheese for sour cream in dips and salad dressings. Have at least 2 servings of low-fat dairy products, such as 2 glasses of skim (or 1%) milk each day to help get your daily calcium intake.  FATS AND OILS Butterfat, lard, and beef fats are high in saturated fat and cholesterol. These should be avoided.Vegetable fats do not contain cholesterol. AVOID coconut oil, palm oil, and palm kernel oil, WHICH are very high in saturated fats. These should be limited. These fats are  often used in bakery goods, processed foods, popcorn, oils, and nondairy creamers. Vegetable shortenings and some peanut butters contain hydrogenated oils, which are also saturated fats. Read the labels on these foods and check for saturated vegetable oils.  Desirable liquid vegetable oils are corn oil, cottonseed oil, olive oil, canola oil, safflower oil, soybean oil, and sunflower oil. Peanut oil is not as good, but small amounts are acceptable. Buy a heart-healthy tub margarine that has no partially hydrogenated oils in the ingredients. AVOID Mayonnaise and salad dressings often are made from unsaturated fats.  OTHER EATING TIPS Snacks  Most sweets should be limited as snacks. They tend to be rich in calories and fats, and their caloric content outweighs their nutritional value. Some good choices in snacks are graham crackers, melba toast, soda crackers, bagels (no egg), English muffins, fruits, and vegetables. These snacks are preferable to snack crackers, [place] fries, and chips. Popcorn should be air-popped or cooked in small amounts  of liquid vegetable oil.  Desserts Eat fruit, low-fat yogurt, and fruit ices instead of pastries, cake, and cookies. Sherbet, angel food cake, gelatin dessert, frozen low-fat yogurt, or other frozen products that do not contain saturated fat (pure fruit juice bars, frozen ice pops) are also acceptable.   COOKING METHODS Choose those methods that use little or no fat. They include: Poaching.  Braising.  Steaming.  Grilling.  Baking.  Stir-frying.  Broiling.  Microwaving.  Foods can be cooked in a nonstick pan without added fat, or use a nonfat cooking spray in regular cookware. Limit fried foods and avoid frying in saturated fat. Add moisture to lean meats by using water, broth, cooking wines, and other nonfat or low-fat sauces along with the cooking methods mentioned above. Soups and stews should be chilled after cooking. The fat that forms on top after a few  hours in the refrigerator should be skimmed off. When preparing meals, avoid using excess salt. Salt can contribute to raising blood pressure in some people.  EATING AWAY FROM HOME Order entres, potatoes, and vegetables without sauces or butter. When meat exceeds the size of a deck of cards (3 to 4 ounces), the rest can be taken home for another meal. Choose vegetable or fruit salads and ask for low-calorie salad dressings to be served on the side. Use dressings sparingly. Limit high-fat toppings, such as bacon, crumbled eggs, cheese, sunflower seeds, and olives. Ask for heart-healthy tub margarine instead of butter.   Gastritis  Gastritis is an inflammation (the body's way of reacting to injury and/or infection) of the stomach. It is often caused by viral or bacterial (germ) infections. It can also be caused BY ASPIRIN, BC/GOODY POWDER'S, (IBUPROFEN) MOTRIN, OR ALEVE (NAPROXEN), chemicals (including alcohol), SPICY FOODS, and medications. This illness may be associated with generalized malaise (feeling tired, not well), UPPER ABDOMINAL STOMACH cramps, and fever. One common bacterial cause of gastritis is an organism known as H. Pylori. This can be treated with antibiotics.   Hiatal Hernia A hiatal hernia occurs when a part of the stomach slides above the diaphragm. The diaphragm is the thin muscle separating the belly (abdomen) from the chest. A hiatal hernia can be something you are born with or develop over time. Hiatal hernias may allow stomach acid to flow back into your esophagus, the tube which carries food from your mouth to your stomach. If this acid causes problems it is called GERD (gastro-esophageal reflux disease).   SYMPTOMS Common symptoms of GERD are heartburn (burning in your chest). This is worse when lying down or bending over. It may also cause belching and indigestion. Some of the things which make GERD worse are:  Increased weight pushes on stomach making acid rise more  easily.   Smoking markedly increases acid production.   Alcohol decreases lower esophageal sphincter pressure (valve between stomach and esophagus), allowing acid from stomach into esophagus.   Late evening meals and going to bed with a full stomach increases pressure.   Anything that causes an increase in acid production.    HOME CARE INSTRUCTIONS  Try to achieve and maintain an ideal body weight.   Avoid drinking alcoholic beverages.   DO NOT smokE.   Do not wear tight clothing around your chest or stomach.   Eat smaller meals and eat more frequently. This keeps your stomach from getting too full. Eat slowly.   Do not lie down for 2 or 3 hours after eating. Do not eat or drink anything 1  to 2 hours before going to bed.   Avoid caffeine beverages (colas, coffee, cocoa, tea), fatty foods, citrus fruits and all other foods and drinks that contain acid and that seem to increase the problems.   Avoid bending over, especially after eating OR STRAINING. Anything that increases the pressure in your belly increases the amount of acid that may be pushed up into your esophagus.    ESOPHAGEAL STRICTURE  Esophageal strictures can be caused by stomach acid backing up into the tube that carries food from the mouth down to the stomach (lower esophagus).  TREATMENT There are a number of medicines used to treat reflux/stricture, including: Antacids.  Proton-pump inhibitors: OMEPRAZOLE  HOME CARE INSTRUCTIONS Eat 2-3 hours before going to bed.  Try to reach and maintain a healthy weight.  Do not eat just a few very large meals. Instead, eat 4 TO 6 smaller meals throughout the day.  Try to identify foods and beverages that make your symptoms worse, and avoid these.  Avoid tight clothing.  Do not exercise right after eating.

## 2013-08-10 NOTE — Op Note (Signed)
Patient called by Samuel CelestineAnitra Kleo Dungee RN contact made patient satisfied with care and no problems noted.  Thanked staff for their wonderful care.

## 2013-08-12 ENCOUNTER — Encounter (HOSPITAL_COMMUNITY): Payer: Self-pay | Admitting: Gastroenterology

## 2013-08-15 ENCOUNTER — Telehealth: Payer: Self-pay | Admitting: Gastroenterology

## 2013-08-15 ENCOUNTER — Encounter (HOSPITAL_COMMUNITY): Payer: Self-pay | Admitting: Gastroenterology

## 2013-08-15 DIAGNOSIS — K219 Gastro-esophageal reflux disease without esophagitis: Secondary | ICD-10-CM

## 2013-08-15 DIAGNOSIS — K2970 Gastritis, unspecified, without bleeding: Secondary | ICD-10-CM

## 2013-08-15 DIAGNOSIS — B9681 Helicobacter pylori [H. pylori] as the cause of diseases classified elsewhere: Secondary | ICD-10-CM

## 2013-08-15 HISTORY — DX: Helicobacter pylori (H. pylori) as the cause of diseases classified elsewhere: B96.81

## 2013-08-15 HISTORY — DX: Gastro-esophageal reflux disease without esophagitis: K21.9

## 2013-08-15 MED ORDER — CLARITHROMYCIN 500 MG PO TABS: ORAL_TABLET | ORAL | Status: DC

## 2013-08-15 MED ORDER — AMOXICILLIN 500 MG PO TABS: ORAL_TABLET | ORAL | Status: DC

## 2013-08-15 NOTE — Telephone Encounter (Signed)
PLEASE CALL PT. HIS ESOPHAGUS BIOPSIES SHOW HIS REFLUX IS UNCONTROLLED. HIS stomach Bx showed H. Pylori infection. He needs AMOXICILLIN 500 mg 2 po BID for 10 days and Biaxin 500 mg po bid for 10 days,. CONTINUE OMEPRAZOLE.  TAKE 30 MINUTES PRIOR TO YOUR MEALS TWICE DAILY. Med side effects include NVD, abd pain, and metallic taste.   STRICTLY AVOID ASPIRIN, BC/GODDY POWDERS, IBUPROFEN/MOTRIN, OR NAPROXEN/ALEVE UNTIL FEB 27. FOLLOW A LOW FAT DIET.  FOLLOW UP IN 3 MOS E30 DYSPHAGIA/GERD.

## 2013-08-17 NOTE — Telephone Encounter (Signed)
Called and informed pt.  

## 2013-08-17 NOTE — Telephone Encounter (Signed)
Reminder in epic °

## 2013-12-14 ENCOUNTER — Emergency Department (HOSPITAL_COMMUNITY)
Admission: EM | Admit: 2013-12-14 | Discharge: 2013-12-14 | Disposition: A | Discharge status: Home / Self Care | Payer: PRIVATE HEALTH INSURANCE | Attending: Emergency Medicine | Admitting: Emergency Medicine

## 2013-12-14 ENCOUNTER — Encounter (HOSPITAL_COMMUNITY): Payer: Self-pay | Admitting: Emergency Medicine

## 2013-12-14 DIAGNOSIS — K219 Gastro-esophageal reflux disease without esophagitis: Secondary | ICD-10-CM | POA: Insufficient documentation

## 2013-12-14 DIAGNOSIS — S61219A Laceration without foreign body of unspecified finger without damage to nail, initial encounter: Secondary | ICD-10-CM

## 2013-12-14 DIAGNOSIS — W292XXA Contact with other powered household machinery, initial encounter: Secondary | ICD-10-CM | POA: Insufficient documentation

## 2013-12-14 DIAGNOSIS — Y9389 Activity, other specified: Secondary | ICD-10-CM | POA: Insufficient documentation

## 2013-12-14 DIAGNOSIS — Z87828 Personal history of other (healed) physical injury and trauma: Secondary | ICD-10-CM | POA: Insufficient documentation

## 2013-12-14 DIAGNOSIS — F17200 Nicotine dependence, unspecified, uncomplicated: Secondary | ICD-10-CM | POA: Insufficient documentation

## 2013-12-14 DIAGNOSIS — S61209A Unspecified open wound of unspecified finger without damage to nail, initial encounter: Secondary | ICD-10-CM | POA: Insufficient documentation

## 2013-12-14 DIAGNOSIS — Z8619 Personal history of other infectious and parasitic diseases: Secondary | ICD-10-CM | POA: Insufficient documentation

## 2013-12-14 DIAGNOSIS — Y929 Unspecified place or not applicable: Secondary | ICD-10-CM | POA: Insufficient documentation

## 2013-12-14 MED ORDER — LIDOCAINE HCL (PF) 1 % IJ SOLN
5.0000 mL | Freq: Once | INTRAMUSCULAR | Status: DC
  Filled 2013-12-14: qty 5

## 2013-12-14 MED ORDER — CEPHALEXIN 500 MG PO CAPS: 500.0000 mg | ORAL_CAPSULE | Freq: Four times a day (QID) | ORAL | Status: DC

## 2013-12-14 MED ORDER — CEPHALEXIN 500 MG PO CAPS
500.0000 mg | ORAL_CAPSULE | Freq: Once | ORAL | Status: AC
  Administered 2013-12-14: 500 mg via ORAL
  Filled 2013-12-14: qty 1

## 2013-12-14 MED ORDER — BACITRACIN ZINC 500 UNIT/GM EX OINT
TOPICAL_OINTMENT | CUTANEOUS | Status: AC
  Filled 2013-12-14: qty 0.9

## 2013-12-14 NOTE — ED Provider Notes (Signed)
CSN: [id number]     Arrival date & time 12/14/13  2110 History   First MD Initiated Contact with Patient 12/14/13 2204     Chief Complaint  Patient presents with  . Laceration     (Consider location/radiation/quality/duration/timing/severity/associated sxs/prior Treatment) Patient is a 48 y.o. male presenting with skin laceration. The history is provided by the patient.  Laceration Location:  Finger Finger laceration location:  L middle finger and L ring finger Depth:  Cutaneous Quality: straight   Bleeding: controlled   Time since incident: just pta. Laceration mechanism:  Knife Pain details:    Quality:  Throbbing   Severity:  Mild   Timing:  Constant   Progression:  Unchanged Foreign body present:  No foreign bodies Relieved by:  Certain positions Worsened by:  Nothing tried Ineffective treatments:  None tried Tetanus status:  Up to date  Patient reports laceration to his fingers occurred while trying to cut plastic with a knife.  He denies difficulty moving his fingers, numbness or weakness to his fingers or persistent bleeding.     Past Medical History  Diagnosis Date  . Helicobacter pylori gastritis 08/15/2013    EGD FEB 2015   . GERD (gastroesophageal reflux disease) 08/15/2013    FEB 2015 200 LBS, BMI 27   . Food impaction of esophagus FEB 2015    BX NEG FOR EOS ESOPHAGITIS   Past Surgical History  Procedure Laterality Date  . Foreign body removal N/A 08/06/2013    Procedure: FOREIGN BODY REMOVAL;  Surgeon: West BaliSandi L Fields, MD;  Location: [facility];  Service: Endoscopy;  Laterality: N/A;   History reviewed. No pertinent family history. History  Substance Use Topics  . Smoking status: Current Every Day Smoker -- 1.00 packs/day  . Smokeless tobacco: Not on file  . Alcohol Use: Yes    Review of Systems  Constitutional: Negative for fever and chills.  Musculoskeletal: Negative for arthralgias, back pain and joint swelling.  Skin: Positive for wound.    Laceration   Neurological: Negative for dizziness, weakness and numbness.  Hematological: Does not bruise/bleed easily.  All other systems reviewed and are negative.     Allergies  Hydrocodone  Home Medications   Prior to Admission medications   Medication Sig Start Date End Date Taking? Authorizing Provider  omeprazole (PRILOSEC) 20 MG capsule Take 20 mg by mouth every morning.   Yes Historical Provider, MD   BP 130/85  Pulse 90  Temp(Src) 98 F (36.7 C) (Oral)  Resp 20  Ht 5\' 9"  (1.753 m)  Wt 190 lb (86.183 kg)  BMI 28.05 kg/m2  SpO2 98% Physical Exam  Nursing note and vitals reviewed. Constitutional: He is oriented to person, place, and time. He appears well-developed and well-nourished. No distress.  HENT:  Head: Normocephalic and atraumatic.  Cardiovascular: Normal rate, regular rhythm, normal heart sounds and intact distal pulses.   No murmur heard. Pulmonary/Chest: Effort normal and breath sounds normal. No respiratory distress.  Musculoskeletal: Normal range of motion. He exhibits no edema.       Left hand: He exhibits tenderness and laceration. He exhibits normal range of motion, no bony tenderness, normal two-point discrimination, normal capillary refill, no deformity and no swelling. Normal sensation noted. Normal strength noted. He exhibits no finger abduction and no thumb/finger opposition.       Hands: Neurological: He is alert and oriented to person, place, and time. He exhibits normal muscle tone. Coordination normal.  Skin: Skin is warm. Laceration noted.  ED Course  Procedures (including critical care time) Labs Review Labs Reviewed - No data to display  Imaging Review No results found.   EKG Interpretation None      LACERATION REPAIR #1 Performed by: TRIPLETT,TAMMY L. Authorized by: Maxwell CaulRIPLETT,TAMMY L. Consent: Verbal consent obtained. Risks and benefits: risks, benefits and alternatives were discussed Consent given by: patient Patient  identity confirmed: provided demographic data Prepped and Draped in normal sterile fashion Wound explored  Laceration Location: left midle finger Laceration Length: 3 cm  No Foreign Bodies seen or palpated  Anesthesia: local infiltration  Local anesthetic: lidocaine 1% w/o epinephrine  Anesthetic total: 2 ml  Irrigation method: syringe Amount of cleaning: standard  Skin closure: 4-0 ethilon Number of sutures: 5 Technique: simple interrupted  Patient tolerance: Patient tolerated the procedure well with no immediate complications.   LACERATION REPAIR #2 Performed by: TRIPLETT,TAMMY L. Authorized by: Maxwell CaulRIPLETT,TAMMY L. Consent: Verbal consent obtained. Risks and benefits: risks, benefits and alternatives were discussed Consent given by: patient Patient identity confirmed: provided demographic data Prepped and Draped in normal sterile fashion Wound explored  Laceration Location: left ring finger Laceration Length: 2 cm  No Foreign Bodies seen or palpated  Anesthesia: local infiltration  Local anesthetic: lidocaine 1% w/o epinephrine  Anesthetic total: 1ml  Irrigation method: syringe Amount of cleaning: standard  Skin closure: 4-0 ethilon Number of sutures: 3 Technique: simple interrupted  Patient tolerance: Patient tolerated the procedure well with no immediate complications.   MDM   Final diagnoses:  Laceration of fingers without complication, initial encounter    Lacerations to the left middle and ring fingers.  Pt has full ROM of the fingers, NV intact.  Sensation intact.  Pt advised to proper wound care , sutures out in 10 days and to return here for any signs of infection.  Fingers were bandaged by nursing staff.    He agrees to plan and appears stable for d/c.      Tammy L. Triplett, PA-C 12/15/13 1320

## 2013-12-14 NOTE — ED Notes (Signed)
Patient states that he cut his left middle and ring finger with a knife.

## 2013-12-14 NOTE — Discharge Instructions (Signed)

## 2013-12-17 NOTE — ED Provider Notes (Signed)
Medical screening examination/treatment/procedure(s) were performed by non-physician practitioner and as supervising physician I was immediately available for consultation/collaboration.   EKG Interpretation None        Vanetta MuldersScott Zackowski, MD 12/17/13 1551

## 2014-09-28 ENCOUNTER — Encounter (HOSPITAL_COMMUNITY): Payer: Self-pay

## 2014-09-28 ENCOUNTER — Emergency Department (HOSPITAL_COMMUNITY): Payer: No Typology Code available for payment source

## 2014-09-28 ENCOUNTER — Emergency Department (HOSPITAL_COMMUNITY)
Admission: EM | Admit: 2014-09-28 | Discharge: 2014-09-28 | Disposition: A | Discharge status: Home / Self Care | Payer: No Typology Code available for payment source | Attending: Emergency Medicine | Admitting: Emergency Medicine

## 2014-09-28 DIAGNOSIS — S20212A Contusion of left front wall of thorax, initial encounter: Secondary | ICD-10-CM | POA: Insufficient documentation

## 2014-09-28 DIAGNOSIS — S62639A Displaced fracture of distal phalanx of unspecified finger, initial encounter for closed fracture: Secondary | ICD-10-CM

## 2014-09-28 DIAGNOSIS — S62632A Displaced fracture of distal phalanx of right middle finger, initial encounter for closed fracture: Secondary | ICD-10-CM | POA: Insufficient documentation

## 2014-09-28 DIAGNOSIS — Y998 Other external cause status: Secondary | ICD-10-CM | POA: Insufficient documentation

## 2014-09-28 DIAGNOSIS — S62629A Displaced fracture of medial phalanx of unspecified finger, initial encounter for closed fracture: Secondary | ICD-10-CM

## 2014-09-28 DIAGNOSIS — Y92410 Unspecified street and highway as the place of occurrence of the external cause: Secondary | ICD-10-CM | POA: Diagnosis not present

## 2014-09-28 DIAGNOSIS — K219 Gastro-esophageal reflux disease without esophagitis: Secondary | ICD-10-CM | POA: Diagnosis not present

## 2014-09-28 DIAGNOSIS — S62614A Displaced fracture of proximal phalanx of right ring finger, initial encounter for closed fracture: Secondary | ICD-10-CM | POA: Insufficient documentation

## 2014-09-28 DIAGNOSIS — Z720 Tobacco use: Secondary | ICD-10-CM | POA: Diagnosis not present

## 2014-09-28 DIAGNOSIS — Y9389 Activity, other specified: Secondary | ICD-10-CM | POA: Insufficient documentation

## 2014-09-28 DIAGNOSIS — Z8619 Personal history of other infectious and parasitic diseases: Secondary | ICD-10-CM | POA: Diagnosis not present

## 2014-09-28 DIAGNOSIS — S299XXA Unspecified injury of thorax, initial encounter: Secondary | ICD-10-CM | POA: Diagnosis present

## 2014-09-28 DIAGNOSIS — S20219A Contusion of unspecified front wall of thorax, initial encounter: Secondary | ICD-10-CM

## 2014-09-28 DIAGNOSIS — Z79899 Other long term (current) drug therapy: Secondary | ICD-10-CM | POA: Diagnosis not present

## 2014-09-28 HISTORY — DX: Fracture of unspecified part of unspecified clavicle, initial encounter for closed fracture: S42.009A

## 2014-09-28 MED ORDER — OXYCODONE-ACETAMINOPHEN 5-325 MG PO TABS: 1.0000 | ORAL_TABLET | ORAL | Status: DC | PRN

## 2014-09-28 MED ORDER — SODIUM CHLORIDE 0.9 % IV BOLUS (SEPSIS)
1000.0000 mL | Freq: Once | INTRAVENOUS | Status: AC
  Administered 2014-09-28: 1000 mL via INTRAVENOUS

## 2014-09-28 MED ORDER — LORAZEPAM 2 MG/ML IJ SOLN
1.0000 mg | Freq: Once | INTRAMUSCULAR | Status: AC
  Administered 2014-09-28: 1 mg via INTRAVENOUS
  Filled 2014-09-28: qty 1

## 2014-09-28 MED ORDER — CEPHALEXIN 500 MG PO CAPS: 500.0000 mg | ORAL_CAPSULE | Freq: Once | ORAL | Status: AC

## 2014-09-28 MED ORDER — HYDROMORPHONE HCL 1 MG/ML IJ SOLN
1.0000 mg | Freq: Once | INTRAMUSCULAR | Status: AC
  Administered 2014-09-28: 1 mg via INTRAVENOUS
  Filled 2014-09-28: qty 1

## 2014-09-28 MED ORDER — KETOROLAC TROMETHAMINE 15 MG/ML IJ SOLN
15.0000 mg | Freq: Once | INTRAMUSCULAR | Status: AC
  Administered 2014-09-28: 15 mg via INTRAVENOUS
  Filled 2014-09-28: qty 1

## 2014-09-28 MED ORDER — CEPHALEXIN 250 MG PO CAPS
500.0000 mg | ORAL_CAPSULE | Freq: Once | ORAL | Status: AC
  Administered 2014-09-28: 500 mg via ORAL
  Filled 2014-09-28: qty 2

## 2014-09-28 MED ORDER — HYDROMORPHONE HCL 1 MG/ML IJ SOLN
0.5000 mg | Freq: Once | INTRAMUSCULAR | Status: AC
Start: 2014-09-28 — End: 2014-09-28
  Administered 2014-09-28: 0.5 mg via INTRAVENOUS
  Filled 2014-09-28: qty 1

## 2014-09-28 NOTE — Discharge Instructions (Signed)
Chest Contusion A chest contusion is a deep bruise on your chest area. Contusions are the result of an injury that caused bleeding under the skin. A chest contusion may involve bruising of the skin, muscles, or ribs. The contusion may turn blue, purple, or yellow. Minor injuries will give you a painless contusion, but more severe contusions may stay painful and swollen for a few weeks. CAUSES  A contusion is usually caused by a blow, trauma, or direct force to an area of the body. SYMPTOMS   Swelling and redness of the injured area.  Discoloration of the injured area.  Tenderness and soreness of the injured area.  Pain. DIAGNOSIS  The diagnosis can be made by taking a history and performing a physical exam. An X-ray, CT scan, or MRI may be needed to determine if there were any associated injuries, such as broken bones (fractures) or internal injuries. TREATMENT  Often, the best treatment for a chest contusion is resting, icing, and applying cold compresses to the injured area. Deep breathing exercises may be recommended to reduce the risk of pneumonia. Over-the-counter medicines may also be recommended for pain control. HOME CARE INSTRUCTIONS   Put ice on the injured area.  Put ice in a plastic bag.  Place a towel between your skin and the bag.  Leave the ice on for 15-20 minutes, 03-04 times a day.  Only take over-the-counter or prescription medicines as directed by your caregiver. Your caregiver may recommend avoiding anti-inflammatory medicines (aspirin, ibuprofen, and naproxen) for 48 hours because these medicines may increase bruising.  Rest the injured area.  Perform deep-breathing exercises as directed by your caregiver.  Stop smoking if you smoke.  Do not lift objects over 5 pounds (2.3 kg) for 3 days or longer if recommended by your caregiver. SEEK IMMEDIATE MEDICAL CARE IF:   You have increased bruising or swelling.  You have pain that is getting worse.  You have  difficulty breathing.  You have dizziness, weakness, or fainting.  You have blood in your urine or stool.  You cough up or vomit blood.  Your swelling or pain is not relieved with medicines. MAKE SURE YOU:   Understand these instructions.  Will watch your condition.  Will get help right away if you are not doing well or get worse. Document Released: 03/06/2001 Document Revised: 03/05/2012 Document Reviewed: 12/03/2011 [facility] Patient Information 2015 [place], [place]. This information is not intended to replace advice given to you by your health care provider. Make sure you discuss any questions you have with your health care provider.  Finger Fracture Fractures of fingers are breaks in the bones of the fingers. There are many types of fractures. There are different ways of treating these fractures. Your health care provider will discuss the best way to treat your fracture. CAUSES Traumatic injury is the main cause of broken fingers. These include:  Injuries while playing sports.  Workplace injuries.  Falls. RISK FACTORS Activities that can increase your risk of finger fractures include:  Sports.  Workplace activities that involve machinery.  A condition called osteoporosis, which can make your bones less dense and cause them to fracture more easily. SIGNS AND SYMPTOMS The main symptoms of a broken finger are pain and swelling within 15 minutes after the injury. Other symptoms include:  Bruising of your finger.  Stiffness of your finger.  Numbness of your finger.  Exposed bones (compound fracture) if the fracture is severe. DIAGNOSIS  The best way to diagnose a broken bone  is with X-ray imaging. Additionally, your health care provider will use this X-ray image to evaluate the position of the broken finger bones.  TREATMENT  Finger fractures can be treated with:   Nonreduction--This means the bones are in place. The finger is splinted without changing the positions  of the bone pieces. The splint is usually left on for about a week to 10 days. This will depend on your fracture and what your health care provider thinks.  Closed reduction--The bones are put back into position without using surgery. The finger is then splinted.  Open reduction and internal fixation--The fracture site is opened. Then the bone pieces are fixed into place with pins or some type of hardware. This is seldom required. It depends on the severity of the fracture. HOME CARE INSTRUCTIONS   Follow your health care provider's instructions regarding activities, exercises, and physical therapy.  Only take over-the-counter or prescription medicines for pain, discomfort, or fever as directed by your health care provider. SEEK MEDICAL CARE IF: You have pain or swelling that limits the motion or use of your fingers. SEEK IMMEDIATE MEDICAL CARE IF:  Your finger becomes numb. MAKE SURE YOU:   Understand these instructions.  Will watch your condition.  Will get help right away if you are not doing well or get worse. Document Released: 09/23/2000 Document Revised: 04/01/2013 Document Reviewed: 01/21/2013 [facility] Patient Information 2015 [place], [place]. This information is not intended to replace advice given to you by your health care provider. Make sure you discuss any questions you have with your health care provider.

## 2014-09-28 NOTE — ED Notes (Signed)
Patient requesting pain medication. Dr. Juleen ChinaKohut aware.

## 2014-09-28 NOTE — ED Notes (Signed)
Dr. Kohut at bedside 
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
## 2014-10-01 ENCOUNTER — Encounter (HOSPITAL_COMMUNITY): Payer: Self-pay | Admitting: Emergency Medicine

## 2014-10-01 ENCOUNTER — Emergency Department (HOSPITAL_COMMUNITY): Payer: No Typology Code available for payment source

## 2014-10-01 ENCOUNTER — Emergency Department (HOSPITAL_COMMUNITY)
Admission: EM | Admit: 2014-10-01 | Discharge: 2014-10-01 | Disposition: A | Discharge status: Home / Self Care | Payer: No Typology Code available for payment source | Attending: Emergency Medicine | Admitting: Emergency Medicine

## 2014-10-01 DIAGNOSIS — Z79899 Other long term (current) drug therapy: Secondary | ICD-10-CM | POA: Diagnosis not present

## 2014-10-01 DIAGNOSIS — K219 Gastro-esophageal reflux disease without esophagitis: Secondary | ICD-10-CM | POA: Diagnosis not present

## 2014-10-01 DIAGNOSIS — S2232XA Fracture of one rib, left side, initial encounter for closed fracture: Secondary | ICD-10-CM | POA: Diagnosis not present

## 2014-10-01 DIAGNOSIS — Y9389 Activity, other specified: Secondary | ICD-10-CM | POA: Diagnosis not present

## 2014-10-01 DIAGNOSIS — Y92410 Unspecified street and highway as the place of occurrence of the external cause: Secondary | ICD-10-CM | POA: Diagnosis not present

## 2014-10-01 DIAGNOSIS — J9801 Acute bronchospasm: Secondary | ICD-10-CM | POA: Insufficient documentation

## 2014-10-01 DIAGNOSIS — Z720 Tobacco use: Secondary | ICD-10-CM | POA: Diagnosis not present

## 2014-10-01 DIAGNOSIS — Z8619 Personal history of other infectious and parasitic diseases: Secondary | ICD-10-CM | POA: Diagnosis not present

## 2014-10-01 DIAGNOSIS — Y998 Other external cause status: Secondary | ICD-10-CM | POA: Diagnosis not present

## 2014-10-01 DIAGNOSIS — S299XXA Unspecified injury of thorax, initial encounter: Secondary | ICD-10-CM | POA: Diagnosis present

## 2014-10-01 MED ORDER — ALBUTEROL SULFATE HFA 108 (90 BASE) MCG/ACT IN AERS
2.0000 | INHALATION_SPRAY | RESPIRATORY_TRACT | Status: DC | PRN
  Administered 2014-10-01: 2 via RESPIRATORY_TRACT
  Filled 2014-10-01: qty 6.7

## 2014-10-01 MED ORDER — IPRATROPIUM-ALBUTEROL 0.5-2.5 (3) MG/3ML IN SOLN
3.0000 mL | Freq: Once | RESPIRATORY_TRACT | Status: AC
  Administered 2014-10-01: 3 mL via RESPIRATORY_TRACT
  Filled 2014-10-01: qty 3

## 2014-10-01 MED ORDER — OXYCODONE HCL 5 MG PO TABS
10.0000 mg | ORAL_TABLET | Freq: Once | ORAL | Status: AC
  Administered 2014-10-01: 10 mg via ORAL
  Filled 2014-10-01: qty 2

## 2014-10-01 MED ORDER — OXYCODONE-ACETAMINOPHEN 7.5-325 MG PO TABS
1.0000 | ORAL_TABLET | ORAL | Status: DC | PRN
Start: 2014-10-01 — End: 2014-10-05

## 2014-10-01 MED ORDER — MAGNESIUM CITRATE PO SOLN
1.0000 | Freq: Once | ORAL | Status: AC
  Administered 2014-10-01: 1 via ORAL
  Filled 2014-10-01: qty 296

## 2014-10-01 NOTE — ED Notes (Signed)
Discharge instructions given, pt demonstrated teach back and verbal understanding. No concerns voiced.  
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
## 2014-10-01 NOTE — Discharge Instructions (Signed)
As an alternative to the finger splint, you can buddy-tape the broken finger to the one next to it.  Rib Fracture A rib fracture is a break or crack in one of the bones of the ribs. The ribs are a group of long, curved bones that wrap around your chest and attach to your spine. They protect your lungs and other organs in the chest cavity. A broken or cracked rib is often painful, but most do not cause other problems. Most rib fractures heal on their own over time. However, rib fractures can be more serious if multiple ribs are broken or if broken ribs move out of place and push against other structures. CAUSES   A direct blow to the chest. For example, this could happen during contact sports, a car accident, or a fall against a hard object.  Repetitive movements with high force, such as pitching a baseball or having severe coughing spells. SYMPTOMS   Pain when you breathe in or cough.  Pain when someone presses on the injured area. DIAGNOSIS  Your caregiver will perform a physical exam. Various imaging tests may be ordered to confirm the diagnosis and to look for related injuries. These tests may include a chest X-ray, computed tomography (CT), magnetic resonance imaging (MRI), or a bone scan. TREATMENT  Rib fractures usually heal on their own in 1-3 months. The longer healing period is often associated with a continued cough or other aggravating activities. During the healing period, pain control is very important. Medication is usually given to control pain. Hospitalization or surgery may be needed for more severe injuries, such as those in which multiple ribs are broken or the ribs have moved out of place.  HOME CARE INSTRUCTIONS   Avoid strenuous activity and any activities or movements that cause pain. Be careful during activities and avoid bumping the injured rib.  Gradually increase activity as directed by your caregiver.  Only take over-the-counter or prescription medications as  directed by your caregiver. Do not take other medications without asking your caregiver first.  Apply ice to the injured area for the first 1-2 days after you have been treated or as directed by your caregiver. Applying ice helps to reduce inflammation and pain.  Put ice in a plastic bag.  Place a towel between your skin and the bag.   Leave the ice on for 15-20 minutes at a time, every 2 hours while you are awake.  Perform deep breathing as directed by your caregiver. This will help prevent pneumonia, which is a common complication of a broken rib. Your caregiver may instruct you to:  Take deep breaths several times a day.  Try to cough several times a day, holding a pillow against the injured area.  Use a device called an incentive spirometer to practice deep breathing several times a day.  Drink enough fluids to keep your urine clear or pale yellow. This will help you avoid constipation.   Do not wear a rib belt or binder. These restrict breathing, which can lead to pneumonia.  SEEK IMMEDIATE MEDICAL CARE IF:   You have a fever.   You have difficulty breathing or shortness of breath.   You develop a continual cough, or you cough up thick or bloody sputum.  You feel sick to your stomach (nausea), throw up (vomit), or have abdominal pain.   You have worsening pain not controlled with medications.  MAKE SURE YOU:  Understand these instructions.  Will watch your condition.  Will get  help right away if you are not doing well or get worse. Document Released: 06/11/2005 Document Revised: 02/11/2013 Document Reviewed: 08/13/2012 [facility] Patient Information 2015 [place], [place]. This information is not intended to replace advice given to you by your health care provider. Make sure you discuss any questions you have with your health care provider.  Bronchospasm A bronchospasm is a spasm or tightening of the airways going into the lungs. During a bronchospasm breathing  becomes more difficult because the airways get smaller. When this happens there can be coughing, a whistling sound when breathing (wheezing), and difficulty breathing. Bronchospasm is often associated with asthma, but not all patients who experience a bronchospasm have asthma. CAUSES  A bronchospasm is caused by inflammation or irritation of the airways. The inflammation or irritation may be triggered by:   Allergies (such as to animals, pollen, food, or mold). Allergens that cause bronchospasm may cause wheezing immediately after exposure or many hours later.   Infection. Viral infections are believed to be the most common cause of bronchospasm.   Exercise.   Irritants (such as pollution, cigarette smoke, strong odors, aerosol sprays, and paint fumes).   Weather changes. Winds increase molds and pollens in the air. Rain refreshes the air by washing irritants out. Cold air may cause inflammation.   Stress and emotional upset.  SIGNS AND SYMPTOMS   Wheezing.   Excessive nighttime coughing.   Frequent or severe coughing with a simple cold.   Chest tightness.   Shortness of breath.  DIAGNOSIS  Bronchospasm is usually diagnosed through a history and physical exam. Tests, such as chest X-rays, are sometimes done to look for other conditions. TREATMENT   Inhaled medicines can be given to open up your airways and help you breathe. The medicines can be given using either an inhaler or a nebulizer machine.  Corticosteroid medicines may be given for severe bronchospasm, usually when it is associated with asthma. HOME CARE INSTRUCTIONS   Always have a plan prepared for seeking medical care. Know when to call your health care provider and local emergency services (911 in the [place] Know where you can access local emergency care.  Only take medicines as directed by your health care provider.  If you were prescribed an inhaler or nebulizer machine, ask your health care provider to  explain how to use it correctly. Always use a spacer with your inhaler if you were given one.  It is necessary to remain calm during an attack. Try to relax and breathe more slowly.  Control your home environment in the following ways:   Change your heating and air conditioning filter at least once a month.   Limit your use of fireplaces and wood stoves.  Do not smoke and do not allow smoking in your home.   Avoid exposure to perfumes and fragrances.   Get rid of pests (such as roaches and mice) and their droppings.   Throw away plants if you see mold on them.   Keep your house clean and dust free.   Replace carpet with wood, tile, or vinyl flooring. Carpet can trap dander and dust.   Use allergy-proof pillows, mattress covers, and box spring covers.   Wash bed sheets and blankets every week in hot water and dry them in a dryer.   Use blankets that are made of polyester or cotton.   Wash hands frequently. SEEK MEDICAL CARE IF:   You have muscle aches.   You have chest pain.   The sputum changes  from clear or white to yellow, green, gray, or bloody.   The sputum you cough up gets thicker.   There are problems that may be related to the medicine you are given, such as a rash, itching, swelling, or trouble breathing.  SEEK IMMEDIATE MEDICAL CARE IF:   You have worsening wheezing and coughing even after taking your prescribed medicines.   You have increased difficulty breathing.   You develop severe chest pain. MAKE SURE YOU:   Understand these instructions.  Will watch your condition.  Will get help right away if you are not doing well or get worse. Document Released: 06/14/2003 Document Revised: 06/16/2013 Document Reviewed: 12/01/2012 [facility] Patient Information 2015 [place], [place]. This information is not intended to replace advice given to you by your health care provider. Make sure you discuss any questions you have with your health care  provider.  Albuterol inhalation aerosol What is this medicine? ALBUTEROL (al Gaspar Bidding) is a bronchodilator. It helps open up the airways in your lungs to make it easier to breathe. This medicine is used to treat and to prevent bronchospasm. This medicine may be used for other purposes; ask your health care provider or pharmacist if you have questions. COMMON BRAND NAME(S): Proair HFA, Proventil, Proventil HFA, Respirol, Ventolin, Ventolin HFA What should I tell my health care provider before I take this medicine? They need to know if you have any of the following conditions: -diabetes -heart disease or irregular heartbeat -high blood pressure -pheochromocytoma -seizures -thyroid disease -an unusual or allergic reaction to albuterol, levalbuterol, sulfites, other medicines, foods, dyes, or preservatives -pregnant or trying to get pregnant -breast-feeding How should I use this medicine? This medicine is for inhalation through the mouth. Follow the directions on your prescription label. Take your medicine at regular intervals. Do not use more often than directed. Make sure that you are using your inhaler correctly. Ask you doctor or health care provider if you have any questions. Talk to your pediatrician regarding the use of this medicine in children. Special care may be needed. Overdosage: If you think you have taken too much of this medicine contact a poison control center or emergency room at once. NOTE: This medicine is only for you. Do not share this medicine with others. What if I miss a dose? If you miss a dose, use it as soon as you can. If it is almost time for your next dose, use only that dose. Do not use double or extra doses. What may interact with this medicine? -anti-infectives like chloroquine and pentamidine -caffeine -cisapride -diuretics -medicines for colds -medicines for depression or for emotional or psychotic conditions -medicines for weight loss including  some herbal products -methadone -some antibiotics like clarithromycin, erythromycin, levofloxacin, and linezolid -some heart medicines -steroid hormones like dexamethasone, cortisone, hydrocortisone -theophylline -thyroid hormones This list may not describe all possible interactions. Give your health care provider a list of all the medicines, herbs, non-prescription drugs, or dietary supplements you use. Also tell them if you smoke, drink alcohol, or use illegal drugs. Some items may interact with your medicine. What should I watch for while using this medicine? Tell your doctor or health care professional if your symptoms do not improve. Do not use extra albuterol. If your asthma or bronchitis gets worse while you are using this medicine, call your doctor right away. If your mouth gets dry try chewing sugarless gum or sucking hard candy. Drink water as directed. What side effects may I notice from receiving  this medicine? Side effects that you should report to your doctor or health care professional as soon as possible: -allergic reactions like skin rash, itching or hives, swelling of the face, lips, or tongue -breathing problems -chest pain -feeling faint or lightheaded, falls -high blood pressure -irregular heartbeat -fever -muscle cramps or weakness -pain, tingling, numbness in the hands or feet -vomiting Side effects that usually do not require medical attention (report to your doctor or health care professional if they continue or are bothersome): -cough -difficulty sleeping -headache -nervousness or trembling -stomach upset -stuffy or runny nose -throat irritation -unusual taste This list may not describe all possible side effects. Call your doctor for medical advice about side effects. You may report side effects to FDA at 1-800-FDA-1088. Where should I keep my medicine? Keep out of the reach of children. Store at room temperature between 15 and 30 degrees C (59 and 86  degrees F). The contents are under pressure and may burst when exposed to heat or flame. Do not freeze. This medicine does not work as well if it is too cold. Throw away any unused medicine after the expiration date. Inhalers need to be thrown away after the labeled number of puffs have been used or by the expiration date; whichever comes first. Ventolin HFA should be thrown away 12 months after removing from foil pouch. Check the instructions that come with your medicine. NOTE: This sheet is a summary. It may not cover all possible information. If you have questions about this medicine, talk to your doctor, pharmacist, or health care provider.  2015, Elsevier/Gold Standard. (2012-11-27 10:57:17)  Acetaminophen; Oxycodone tablets What is this medicine? ACETAMINOPHEN; OXYCODONE (a set a MEE noe fen; ox i KOE done) is a pain reliever. It is used to treat mild to moderate pain. This medicine may be used for other purposes; ask your health care provider or pharmacist if you have questions. COMMON BRAND NAME(S): Endocet, Magnacet, Narvox, Percocet, Perloxx, Primalev, Primlev, Roxicet, Xolox What should I tell my health care provider before I take this medicine? They need to know if you have any of these conditions: -brain tumor -Crohn's disease, inflammatory bowel disease, or ulcerative colitis -drug abuse or addiction -head injury -heart or circulation problems -if you often drink alcohol -kidney disease or problems going to the bathroom -liver disease -lung disease, asthma, or breathing problems -an unusual or allergic reaction to acetaminophen, oxycodone, other opioid analgesics, other medicines, foods, dyes, or preservatives -pregnant or trying to get pregnant -breast-feeding How should I use this medicine? Take this medicine by mouth with a full glass of water. Follow the directions on the prescription label. Take your medicine at regular intervals. Do not take your medicine more often than  directed. Talk to your pediatrician regarding the use of this medicine in children. Special care may be needed. Patients over 53 years old may have a stronger reaction and need a smaller dose. Overdosage: If you think you have taken too much of this medicine contact a poison control center or emergency room at once. NOTE: This medicine is only for you. Do not share this medicine with others. What if I miss a dose? If you miss a dose, take it as soon as you can. If it is almost time for your next dose, take only that dose. Do not take double or extra doses. What may interact with this medicine? -alcohol -antihistamines -barbiturates like amobarbital, butalbital, butabarbital, methohexital, pentobarbital, phenobarbital, thiopental, and secobarbital -benztropine -drugs for bladder problems like solifenacin,  trospium, oxybutynin, tolterodine, hyoscyamine, and methscopolamine -drugs for breathing problems like ipratropium and tiotropium -drugs for certain stomach or intestine problems like propantheline, homatropine methylbromide, glycopyrrolate, atropine, belladonna, and dicyclomine -general anesthetics like etomidate, ketamine, nitrous oxide, propofol, desflurane, enflurane, halothane, isoflurane, and sevoflurane -medicines for depression, anxiety, or psychotic disturbances -medicines for sleep -muscle relaxants -naltrexone -narcotic medicines (opiates) for pain -phenothiazines like perphenazine, thioridazine, chlorpromazine, mesoridazine, fluphenazine, prochlorperazine, promazine, and trifluoperazine -scopolamine -tramadol -trihexyphenidyl This list may not describe all possible interactions. Give your health care provider a list of all the medicines, herbs, non-prescription drugs, or dietary supplements you use. Also tell them if you smoke, drink alcohol, or use illegal drugs. Some items may interact with your medicine. What should I watch for while using this medicine? Tell your doctor or  health care professional if your pain does not go away, if it gets worse, or if you have new or a different type of pain. You may develop tolerance to the medicine. Tolerance means that you will need a higher dose of the medication for pain relief. Tolerance is normal and is expected if you take this medicine for a long time. Do not suddenly stop taking your medicine because you may develop a severe reaction. Your body becomes used to the medicine. This does NOT mean you are addicted. Addiction is a behavior related to getting and using a drug for a non-medical reason. If you have pain, you have a medical reason to take pain medicine. Your doctor will tell you how much medicine to take. If your doctor wants you to stop the medicine, the dose will be slowly lowered over time to avoid any side effects. You may get drowsy or dizzy. Do not drive, use machinery, or do anything that needs mental alertness until you know how this medicine affects you. Do not stand or sit up quickly, especially if you are an older patient. This reduces the risk of dizzy or fainting spells. Alcohol may interfere with the effect of this medicine. Avoid alcoholic drinks. There are different types of narcotic medicines (opiates) for pain. If you take more than one type at the same time, you may have more side effects. Give your health care provider a list of all medicines you use. Your doctor will tell you how much medicine to take. Do not take more medicine than directed. Call emergency for help if you have problems breathing. The medicine will cause constipation. Try to have a bowel movement at least every 2 to 3 days. If you do not have a bowel movement for 3 days, call your doctor or health care professional. Do not take Tylenol (acetaminophen) or medicines that have acetaminophen with this medicine. Too much acetaminophen can be very dangerous. Many nonprescription medicines contain acetaminophen. Always read the labels carefully to  avoid taking more acetaminophen. What side effects may I notice from receiving this medicine? Side effects that you should report to your doctor or health care professional as soon as possible: -allergic reactions like skin rash, itching or hives, swelling of the face, lips, or tongue -breathing difficulties, wheezing -confusion -light headedness or fainting spells -severe stomach pain -unusually weak or tired -yellowing of the skin or the whites of the eyes Side effects that usually do not require medical attention (report to your doctor or health care professional if they continue or are bothersome): -dizziness -drowsiness -nausea -vomiting This list may not describe all possible side effects. Call your doctor for medical advice about side effects. You may report  side effects to FDA at 1-800-FDA-1088. Where should I keep my medicine? Keep out of the reach of children. This medicine can be abused. Keep your medicine in a safe place to protect it from theft. Do not share this medicine with anyone. Selling or giving away this medicine is dangerous and against the law. Store at room temperature between 20 and 25 degrees C (68 and 77 degrees F). Keep container tightly closed. Protect from light. This medicine may cause accidental overdose and death if it is taken by other adults, children, or pets. Flush any unused medicine down the toilet to reduce the chance of harm. Do not use the medicine after the expiration date. NOTE: This sheet is a summary. It may not cover all possible information. If you have questions about this medicine, talk to your doctor, pharmacist, or health care provider.  2015, Elsevier/Gold Standard. (2013-02-02 13:17:35)   Emergency Department Resource Guide 1) Find a Doctor and Pay Out of Pocket Although you won't have to find out who is covered by your insurance plan, it is a good idea to ask around and get recommendations. You will then need to call the office and see  if the doctor you have chosen will accept you as a new patient and what types of options they offer for patients who are self-pay. Some doctors offer discounts or will set up payment plans for their patients who do not have insurance, but you will need to ask so you aren't surprised when you get to your appointment.  2) Contact Your Local Health Department Not all health departments have doctors that can see patients for sick visits, but many do, so it is worth a call to see if yours does. If you don't know where your local health department is, you can check in your phone book. The CDC also has a tool to help you locate your state's health department, and many state websites also have listings of all of their local health departments.  3) Find a Walk-in Clinic If your illness is not likely to be very severe or complicated, you may want to try a walk in clinic. These are popping up all over the country in pharmacies, drugstores, and shopping centers. They're usually staffed by nurse practitioners or physician assistants that have been trained to treat common illnesses and complaints. They're usually fairly quick and inexpensive. However, if you have serious medical issues or chronic medical problems, these are probably not your best option.  No Primary Care Doctor: - Call [facility] at  [phone] - they can help you locate a primary care doctor that  accepts your insurance, provides certain services, etc. - Physician Referral Service- [phone]  Chronic Pain Problems: Organization         Address  Phone   Notes  [facility]  [phone] Patients need to be referred by their primary care doctor.   Medication Assistance: Organization         Address  Phone   Notes  [facility] [address] [place], [place] [id number] [phone] --Must be a resident of [place] -- Must have NO insurance coverage whatsoever (no  Medicaid/ Medicare, etc.) -- The pt. MUST have a primary care doctor that directs their care regularly and follows them in the community   [facility]  [phone]   [employer]  [phone]    Agencies that provide inexpensive medical care: Organization  Address  Phone   Notes  [place]  [phone]   [facility]    [phone]   [facility] [place], [place] [address] [phone]   [facility] [address], [place] [phone]   [facility]    [phone]   [facility]    [phone]   [place] [address], [place] Phone:  [phone], Fax:  [phone] Hours of Operation:  9 am - 6 pm, M-F.  Also accepts Medicaid/Medicare and self-pay.  [facility] for [place], [address], [place] Phone: [phone], Fax: [phone]. Hours of Operation:  8:30 am - 5:30 pm, M-F.  Also accepts Medicaid and self-pay.  [facility] [place] [address], [place] Phone: [phone]   [place], [place], [place] [phone], Ext. [phone] Mondays & Thursdays: 7-9 AM.  First 15 patients are seen on a first come, first serve basis.    [place] Providers:  Organization         Address  Phone   Notes  [facility] [address], [place] [phone] Also accepts self-pay patients.  [facility] [id number] [place], [place]  [phone]   [place], [address], [place] [phone]   [facility] [address] [place] [phone]   Lucianne Lei [address], [place]   [phone] Only accepts [place] patients after they have their name applied to their card.    Self-Pay (no insurance) in [facility]:  Organization         Address  Phone   Notes  Sickle Cell Patients, [facility] [place] [phone]   [facility] [place] [phone]   [facility] [place]  [place], [place], [place] [phone]   [facility]/Dr. Osei-Bonsu  [address], [place] [address], [place] [phone] Phone number for both [place] and [place] locations is the same.  [facility] [address] [place] [phone]   [facility] [address], [place] or [address] [phone]   [facility] [address] [place] [phone], phone; [phone], fax Sees patients 1st and 3rd Saturday of every month.  Must not qualify for public or private insurance (i.e. Medicaid, Medicare, [place] Health Choice, Veterans' Benefits)  Household income should be no more than 200% of the poverty level The clinic cannot treat you if you are pregnant or think you are pregnant  Sexually transmitted diseases are not treated at the clinic.    Dental Care: Organization         Address  Phone  Notes  [facility] Department of [facility] [phone] Accepts children up to age 20 who are enrolled in [place] or [place]; pregnant women with a Medicaid card; and children who have applied for Medicaid or [place] Health Choice, but were declined, whose parents can pay a reduced fee at time of  service.  [facility] Department of [facility]  [address], [place] [phone] Accepts children up to age 8 who are enrolled in [place] or [place] Health Choice; pregnant women with a Medicaid card; and children who have applied for Medicaid or  Health Choice,  but were declined, whose parents can pay a reduced fee at time of service.  [facility]  [address] [place], [place] [phone] Patients are seen by appointment only. Walk-ins are not accepted. [facility] will see patients 29 years of age and older. Monday - Tuesday (8am-5pm) Most Wednesdays (8:30-5pm) $30 per visit, cash only  [facility]  [address], [facility] [phone] Patients are seen by appointment only. Walk-ins are not accepted. [facility] will see patients 59 years of age and older. One Wednesday Evening (Monthly: Volunteer Based).  $30 per visit, cash only  [employer]  [phone] for adults; Children under age 72, call Graduate Pediatric Dentistry at [phone]. Children aged 70-14, please call [phone] to request a pediatric application.  Dental services are provided in all areas of dental care including fillings, crowns and bridges, complete and partial dentures, implants, gum treatment, root canals, and extractions. Preventive care is also provided. Treatment is provided to both adults and children. Patients are selected via a lottery and there is often a waiting list.   [facility] [address] [place]  [phone] [email or web address]   [facility] [address] [place], [place] [phone], Ext. [phone] Second and Fourth Thursday of each month, opens at 6:30 AM; Clinic ends at 9 AM.  Patients are seen on a first-come first-served basis, and a limited number are seen during each clinic.   [facility]  [address] [place], [place] [phone]   Eligibility Requirements You must have lived in [place], [place], or [place] counties for at least the last three months.   You cannot be eligible for state or federal sponsored [place], including [employer], [place], or [employer].   You generally  cannot be eligible for healthcare insurance through your employer.    How to apply: Eligibility screenings are held every Tuesday and Wednesday afternoon from 1:00 pm until 4:00 pm. You do not need an appointment for the interview!  [facility] [address] [place], [place] [phone]   [facility] Health Department  [phone]   [facility] Health Department  [phone]   [facility] Health Department  [phone]
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
## 2014-10-05 ENCOUNTER — Encounter (HOSPITAL_COMMUNITY): Payer: Self-pay | Admitting: Emergency Medicine

## 2014-10-05 ENCOUNTER — Emergency Department (HOSPITAL_COMMUNITY)
Admission: EM | Admit: 2014-10-05 | Discharge: 2014-10-05 | Disposition: A | Discharge status: Home / Self Care | Payer: No Typology Code available for payment source | Attending: Emergency Medicine | Admitting: Emergency Medicine

## 2014-10-05 DIAGNOSIS — Z041 Encounter for examination and observation following transport accident: Secondary | ICD-10-CM | POA: Diagnosis not present

## 2014-10-05 DIAGNOSIS — Z043 Encounter for examination and observation following other accident: Secondary | ICD-10-CM

## 2014-10-05 DIAGNOSIS — Z87828 Personal history of other (healed) physical injury and trauma: Secondary | ICD-10-CM | POA: Insufficient documentation

## 2014-10-05 DIAGNOSIS — Z79899 Other long term (current) drug therapy: Secondary | ICD-10-CM | POA: Diagnosis not present

## 2014-10-05 DIAGNOSIS — Z8781 Personal history of (healed) traumatic fracture: Secondary | ICD-10-CM | POA: Insufficient documentation

## 2014-10-05 DIAGNOSIS — Z720 Tobacco use: Secondary | ICD-10-CM | POA: Insufficient documentation

## 2014-10-05 DIAGNOSIS — K219 Gastro-esophageal reflux disease without esophagitis: Secondary | ICD-10-CM | POA: Insufficient documentation

## 2014-10-05 DIAGNOSIS — R0789 Other chest pain: Secondary | ICD-10-CM | POA: Diagnosis not present

## 2014-10-05 DIAGNOSIS — Z792 Long term (current) use of antibiotics: Secondary | ICD-10-CM | POA: Diagnosis not present

## 2014-10-05 DIAGNOSIS — Z8619 Personal history of other infectious and parasitic diseases: Secondary | ICD-10-CM | POA: Diagnosis not present

## 2014-10-05 DIAGNOSIS — R0602 Shortness of breath: Secondary | ICD-10-CM | POA: Diagnosis present

## 2014-10-05 MED ORDER — ALBUTEROL SULFATE HFA 108 (90 BASE) MCG/ACT IN AERS
2.0000 | INHALATION_SPRAY | Freq: Once | RESPIRATORY_TRACT | Status: AC
  Administered 2014-10-05: 2 via RESPIRATORY_TRACT
  Filled 2014-10-05: qty 6.7

## 2014-10-05 MED ORDER — OXYCODONE-ACETAMINOPHEN 5-325 MG PO TABS
1.0000 | ORAL_TABLET | Freq: Four times a day (QID) | ORAL | Status: AC | PRN
Start: 2014-10-05 — End: ?

## 2014-10-05 MED ORDER — METHOCARBAMOL 500 MG PO TABS: 500.0000 mg | ORAL_TABLET | Freq: Three times a day (TID) | ORAL | Status: AC
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
## 2014-10-05 NOTE — Discharge Instructions (Signed)
Chest Wall Pain PLEASE CALL THE [facility] OR THE [facility] TO ASSIST YOU WITH YOUR MANAGEMENT UNTIL YOU SECURE A PRIMARY MD.  Chest wall pain is pain felt in or around the chest bones and muscles. It may take up to 6 weeks to get better. It may take longer if you are active. Chest wall pain can happen on its own. Other times, things like germs, injury, coughing, or exercise can cause the pain. HOME CARE   Avoid activities that make you tired or cause pain. Try not to use your chest, belly (abdominal), or side muscles. Do not use heavy weights.  Put ice on the sore area.  Put ice in a plastic bag.  Place a towel between your skin and the bag.  Leave the ice on for 15-20 minutes for the first 2 days.  Only take medicine as told by your doctor. GET HELP RIGHT AWAY IF:   You have more pain or are very uncomfortable.  You have a fever.  Your chest pain gets worse.  You have new problems.  You feel sick to your stomach (nauseous) or throw up (vomit).  You start to sweat or feel lightheaded.  You have a cough with mucus (phlegm).  You cough up blood. MAKE SURE YOU:   Understand these instructions.  Will watch your condition.  Will get help right away if you are not doing well or get worse. Document Released: 11/28/2007 Document Revised: 09/03/2011 Document Reviewed: 02/05/2011 [facility] Patient Information 2015 [place], [place]. This information is not intended to replace advice given to you by your health care provider. Make sure you discuss any questions you have with your health care provider.
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
